# Patient Record
Sex: Female | Born: 1955
Health system: Southern US, Community
[De-identification: ages and names within clinical notes are randomized; demographics above are authoritative.]

## PROBLEM LIST (undated history)

## (undated) DIAGNOSIS — T7840XA Allergy, unspecified, initial encounter: Secondary | ICD-10-CM

## (undated) DIAGNOSIS — Z9289 Personal history of other medical treatment: Secondary | ICD-10-CM

## (undated) DIAGNOSIS — I890 Lymphedema, not elsewhere classified: Secondary | ICD-10-CM

## (undated) DIAGNOSIS — R011 Cardiac murmur, unspecified: Secondary | ICD-10-CM

## (undated) DIAGNOSIS — D649 Anemia, unspecified: Secondary | ICD-10-CM

## (undated) DIAGNOSIS — I10 Essential (primary) hypertension: Secondary | ICD-10-CM

## (undated) DIAGNOSIS — Z972 Presence of dental prosthetic device (complete) (partial): Secondary | ICD-10-CM

## (undated) DIAGNOSIS — K635 Polyp of colon: Secondary | ICD-10-CM

## (undated) DIAGNOSIS — E669 Obesity, unspecified: Secondary | ICD-10-CM

## (undated) DIAGNOSIS — M1990 Unspecified osteoarthritis, unspecified site: Secondary | ICD-10-CM

## (undated) HISTORY — PX: POLYPECTOMY: SHX149

## (undated) HISTORY — DX: Lymphedema, not elsewhere classified: I89.0

## (undated) HISTORY — DX: Presence of dental prosthetic device (complete) (partial): Z97.2

## (undated) HISTORY — PX: VEIN LIGATION AND STRIPPING: SHX2653

## (undated) HISTORY — DX: Allergy, unspecified, initial encounter: T78.40XA

## (undated) HISTORY — DX: Anemia, unspecified: D64.9

## (undated) HISTORY — DX: Cardiac murmur, unspecified: R01.1

## (undated) HISTORY — DX: Polyp of colon: K63.5

## (undated) HISTORY — DX: Obesity, unspecified: E66.9

## (undated) HISTORY — DX: Essential (primary) hypertension: I10

## (undated) HISTORY — PX: FINGER SURGERY: SHX640

## (undated) HISTORY — PX: COLONOSCOPY: SHX174

## (undated) HISTORY — DX: Unspecified osteoarthritis, unspecified site: M19.90

## (undated) HISTORY — DX: Personal history of other medical treatment: Z92.89

---

## 1997-11-10 ENCOUNTER — Other Ambulatory Visit: Admission: RE | Admit: 1997-11-10 | Discharge: 1997-11-10 | Payer: Self-pay | Admitting: Obstetrics

## 1999-12-11 ENCOUNTER — Other Ambulatory Visit: Admission: RE | Admit: 1999-12-11 | Discharge: 1999-12-11 | Payer: Self-pay | Admitting: Obstetrics

## 2000-02-14 ENCOUNTER — Encounter: Payer: Self-pay | Admitting: Obstetrics

## 2000-02-14 ENCOUNTER — Encounter: Admission: RE | Admit: 2000-02-14 | Discharge: 2000-02-14 | Payer: Self-pay | Admitting: Obstetrics

## 2001-02-13 ENCOUNTER — Encounter: Payer: Self-pay | Admitting: Obstetrics

## 2001-02-13 ENCOUNTER — Encounter: Admission: RE | Admit: 2001-02-13 | Discharge: 2001-02-13 | Payer: Self-pay | Admitting: Obstetrics

## 2002-02-18 ENCOUNTER — Encounter: Admission: RE | Admit: 2002-02-18 | Discharge: 2002-02-18 | Payer: Self-pay | Admitting: Obstetrics

## 2002-02-18 ENCOUNTER — Encounter: Payer: Self-pay | Admitting: Obstetrics

## 2003-02-22 ENCOUNTER — Encounter: Admission: RE | Admit: 2003-02-22 | Discharge: 2003-02-22 | Payer: Self-pay | Admitting: Obstetrics

## 2003-02-22 ENCOUNTER — Encounter: Payer: Self-pay | Admitting: Obstetrics

## 2004-06-16 ENCOUNTER — Encounter: Admission: RE | Admit: 2004-06-16 | Discharge: 2004-06-16 | Payer: Self-pay | Admitting: Obstetrics

## 2005-07-17 ENCOUNTER — Encounter: Admission: RE | Admit: 2005-07-17 | Discharge: 2005-07-17 | Payer: Self-pay | Admitting: Obstetrics

## 2006-08-14 ENCOUNTER — Encounter: Admission: RE | Admit: 2006-08-14 | Discharge: 2006-08-14 | Payer: Self-pay | Admitting: Family Medicine

## 2007-09-11 ENCOUNTER — Encounter: Admission: RE | Admit: 2007-09-11 | Discharge: 2007-09-11 | Payer: Self-pay | Admitting: Family Medicine

## 2008-09-13 ENCOUNTER — Encounter: Admission: RE | Admit: 2008-09-13 | Discharge: 2008-09-13 | Payer: Self-pay | Admitting: Family Medicine

## 2009-10-24 ENCOUNTER — Encounter: Admission: RE | Admit: 2009-10-24 | Discharge: 2009-10-24 | Payer: Self-pay | Admitting: Family Medicine

## 2010-07-02 ENCOUNTER — Encounter: Payer: Self-pay | Admitting: Family Medicine

## 2010-10-18 ENCOUNTER — Other Ambulatory Visit: Payer: Self-pay | Admitting: Family Medicine

## 2010-10-18 DIAGNOSIS — Z1231 Encounter for screening mammogram for malignant neoplasm of breast: Secondary | ICD-10-CM

## 2010-10-27 ENCOUNTER — Ambulatory Visit
Admission: RE | Admit: 2010-10-27 | Discharge: 2010-10-27 | Disposition: A | Discharge status: Home / Self Care | Payer: BC Managed Care – PPO | Source: Ambulatory Visit | Attending: Family Medicine | Admitting: Family Medicine

## 2010-10-27 DIAGNOSIS — Z1231 Encounter for screening mammogram for malignant neoplasm of breast: Secondary | ICD-10-CM

## 2011-06-04 ENCOUNTER — Ambulatory Visit (HOSPITAL_COMMUNITY)
Admission: RE | Admit: 2011-06-04 | Discharge: 2011-06-04 | Disposition: A | Discharge status: Home / Self Care | Payer: BC Managed Care – PPO | Source: Ambulatory Visit | Attending: Family Medicine | Admitting: Family Medicine

## 2011-06-04 ENCOUNTER — Ambulatory Visit (INDEPENDENT_AMBULATORY_CARE_PROVIDER_SITE_OTHER): Payer: BC Managed Care – PPO

## 2011-06-04 DIAGNOSIS — R609 Edema, unspecified: Secondary | ICD-10-CM

## 2011-06-04 DIAGNOSIS — M7989 Other specified soft tissue disorders: Secondary | ICD-10-CM

## 2011-06-04 DIAGNOSIS — M79609 Pain in unspecified limb: Secondary | ICD-10-CM

## 2011-06-04 DIAGNOSIS — N390 Urinary tract infection, site not specified: Secondary | ICD-10-CM

## 2011-06-04 NOTE — Progress Notes (Signed)
*  PRELIMINARY RESULTS*  Left lower extremity venous duplex completed. No evidence of DVT, superficial thrombosis, or Baker's cyst.  Gina Lopez, [place] D 06/04/2011, 12:20 PM

## 2011-06-05 ENCOUNTER — Ambulatory Visit (INDEPENDENT_AMBULATORY_CARE_PROVIDER_SITE_OTHER): Payer: BC Managed Care – PPO

## 2011-06-05 DIAGNOSIS — M79609 Pain in unspecified limb: Secondary | ICD-10-CM

## 2011-06-05 DIAGNOSIS — L0201 Cutaneous abscess of face: Secondary | ICD-10-CM

## 2011-06-05 DIAGNOSIS — A46 Erysipelas: Secondary | ICD-10-CM

## 2011-06-12 ENCOUNTER — Ambulatory Visit (INDEPENDENT_AMBULATORY_CARE_PROVIDER_SITE_OTHER): Payer: BC Managed Care – PPO

## 2011-06-12 DIAGNOSIS — I872 Venous insufficiency (chronic) (peripheral): Secondary | ICD-10-CM

## 2011-06-12 DIAGNOSIS — M79609 Pain in unspecified limb: Secondary | ICD-10-CM

## 2011-06-12 DIAGNOSIS — R609 Edema, unspecified: Secondary | ICD-10-CM

## 2011-09-27 ENCOUNTER — Ambulatory Visit (INDEPENDENT_AMBULATORY_CARE_PROVIDER_SITE_OTHER): Payer: BC Managed Care – PPO | Admitting: Family Medicine

## 2011-09-27 VITALS — BP 110/71 | HR 105 | Temp 100.1°F | Resp 18 | Ht 68.5 in | Wt 268.0 lb

## 2011-09-27 DIAGNOSIS — I10 Essential (primary) hypertension: Secondary | ICD-10-CM

## 2011-09-27 DIAGNOSIS — L0390 Cellulitis, unspecified: Secondary | ICD-10-CM

## 2011-09-27 LAB — POCT CBC
Granulocyte percent: 89.7 %G — AB (ref 37–80)
MID (cbc): 0.5 (ref 0–0.9)
MPV: 10.4 fL (ref 0–99.8)
POC Granulocyte: 16.5 — AB (ref 2–6.9)
POC LYMPH PERCENT: 7.4 %L — AB (ref 10–50)
POC MID %: 2.9 %M (ref 0–12)
Platelet Count, POC: 175 10*3/uL (ref 142–424)
RDW, POC: 15.9 %

## 2011-09-27 MED ORDER — DOXYCYCLINE HYCLATE 100 MG PO TABS: 100.0000 mg | ORAL_TABLET | Freq: Two times a day (BID) | ORAL | Status: DC

## 2011-09-27 NOTE — Progress Notes (Signed)
  Subjective:    Patient ID: Gina Lopez, female    DOB: [birth year], 56 y.o.   MRN: [id number]  HPI 56 yo female here with: 1) left leg pain, redness, and tenderness.  Swollen.  Started yesterday.  Same thing in December.  Was good untnil yesterday.  Also has fever, chills, bodyaches.  Treated for cellulitis and improved.  Anterior leg (shin).  No shortness of breath, chest pain.  Negative doppler during previous episode.    Review of Systems Negative except as per HPI     Objective:   Physical Exam  Constitutional: She appears well-developed.  Cardiovascular: Normal rate, regular rhythm, normal heart sounds and intact distal pulses.   Pulmonary/Chest: Effort normal and breath sounds normal.  Neurological: She is alert.   Left leg - anterior lower leg with erythema 2/3 up shin, warm, tender to palpation  Results for orders placed in visit on 09/27/11  POCT CBC      Component Value Range   WBC 18.4 (*) 4.6 - 10.2 (K/uL)   Lymph, poc 1.4  0.6 - 3.4    POC LYMPH PERCENT 7.4 (*) 10 - 50 (%L)   MID (cbc) 0.5  0 - 0.9    POC MID % 2.9  0 - 12 (%M)   POC Granulocyte 16.5 (*) 2 - 6.9    Granulocyte percent 89.7 (*) 37 - 80 (%G)   RBC 4.23  4.04 - 5.48 (M/uL)   Hemoglobin 10.5 (*) 12.2 - 16.2 (g/dL)   HCT, POC 40.9 (*) 81.1 - 47.9 (%)   MCV 79.5 (*) 80 - 97 (fL)   MCH, POC 24.8 (*) 27 - 31.2 (pg)   MCHC 31.3 (*) 31.8 - 35.4 (g/dL)   RDW, POC 91.4     Platelet Count, POC 175  142 - 424 (K/uL)   MPV 10.4  0 - 99.8 (fL)        Assessment & Plan:  LE cellulitis, supported by past history and white count.  Start doxycycline. F/u in 24-48 hours.

## 2011-09-28 LAB — BASIC METABOLIC PANEL
Calcium: 8.8 mg/dL (ref 8.4–10.5)
Potassium: 3.6 mEq/L (ref 3.5–5.3)
Sodium: 140 mEq/L (ref 135–145)

## 2011-09-29 ENCOUNTER — Encounter: Payer: Self-pay | Admitting: Emergency Medicine

## 2011-09-29 ENCOUNTER — Ambulatory Visit (INDEPENDENT_AMBULATORY_CARE_PROVIDER_SITE_OTHER): Payer: BC Managed Care – PPO | Admitting: Family Medicine

## 2011-09-29 VITALS — BP 125/75 | HR 75 | Temp 98.5°F | Resp 16 | Ht 68.5 in | Wt 268.6 lb

## 2011-09-29 DIAGNOSIS — I10 Essential (primary) hypertension: Secondary | ICD-10-CM

## 2011-09-29 DIAGNOSIS — L0291 Cutaneous abscess, unspecified: Secondary | ICD-10-CM

## 2011-09-29 DIAGNOSIS — L0390 Cellulitis, unspecified: Secondary | ICD-10-CM

## 2011-09-29 DIAGNOSIS — B353 Tinea pedis: Secondary | ICD-10-CM

## 2011-09-29 LAB — POCT CBC
Lymph, poc: 2.8 (ref 0.6–3.4)
MCH, POC: 24.7 pg — AB (ref 27–31.2)
MCHC: 31 g/dL — AB (ref 31.8–35.4)
MCV: 79.4 fL — AB (ref 80–97)
MID (cbc): 0.5 (ref 0–0.9)
MPV: 11.4 fL (ref 0–99.8)
POC MID %: 4.9 %M (ref 0–12)
Platelet Count, POC: 233 10*3/uL (ref 142–424)
WBC: 9.2 10*3/uL (ref 4.6–10.2)

## 2011-09-29 MED ORDER — LISINOPRIL-HYDROCHLOROTHIAZIDE 10-12.5 MG PO TABS: 1.0000 | ORAL_TABLET | Freq: Every day | ORAL | Status: DC

## 2011-09-29 NOTE — Progress Notes (Signed)
Subjective: Patient is here for a followup with regard to the cellulitis in her ankle. It is less erythematous than discomfort and discomfort is decreased from 2 days ago. Her white blood count was high. Her sugar was good at that time. She knows of no initial injury, hours she has cracking between her fourth and fifth toes  She wanted in blood pressure or refill  Objective: Very edematous left leg from the calf down. No calf tenderness. The erythema is decreased as per her description. The foot is a little bit warm. She is afebrile now. The toes have some cracking between them.  Assessment: Cellulitis Tinea pedis Hypertension  Plan: Continue antibiotics Check CBC Lamisil cream Call in a prescription for blood pressure medicine  Results for orders placed in visit on 09/29/11  POCT CBC      Component Value Range   WBC 9.2  4.6 - 10.2 (K/uL)   Lymph, poc 2.8  0.6 - 3.4    POC LYMPH PERCENT 30.3  10 - 50 (%L)   MID (cbc) 0.5  0 - 0.9    POC MID % 4.9  0 - 12 (%M)   POC Granulocyte 6.0  2 - 6.9    Granulocyte percent 64.8  37 - 80 (%G)   RBC 4.58  4.04 - 5.48 (M/uL)   Hemoglobin 11.3 (*) 12.2 - 16.2 (g/dL)   HCT, POC 29.5 (*) 62.1 - 47.9 (%)   MCV 79.4 (*) 80 - 97 (fL)   MCH, POC 24.7 (*) 27 - 31.2 (pg)   MCHC 31.0 (*) 31.8 - 35.4 (g/dL)   RDW, POC 30.8     Platelet Count, POC 233  142 - 424 (K/uL)   MPV 11.4  0 - 99.8 (fL)

## 2011-09-29 NOTE — Patient Instructions (Signed)
Use over-the-counter Lamisil cream generic terbinafine). Continue the doxycycline. Keep leg elevated as able.  I called in a blood pressure medication refill.

## 2011-10-02 ENCOUNTER — Telehealth: Payer: Self-pay

## 2011-10-02 NOTE — Telephone Encounter (Signed)
Patient states that she was here on Saturday and her legs are swollen worse, warm to touch and hurting worse. The redness has gotten better.  She feels like the antibiotic is not working.  Can you please advise on this?

## 2011-10-02 NOTE — Telephone Encounter (Signed)
.  umfc The patient called regarding her cellulitis of her leg.  Patient states she is on doxycycline and she does not feel that it is working.  Patient states that the area is still very swollen and hard.  Please call patient at [phone] to discuss her symptoms.

## 2011-10-03 ENCOUNTER — Ambulatory Visit (INDEPENDENT_AMBULATORY_CARE_PROVIDER_SITE_OTHER): Payer: BC Managed Care – PPO | Admitting: Family Medicine

## 2011-10-03 ENCOUNTER — Ambulatory Visit (HOSPITAL_COMMUNITY)
Admission: RE | Admit: 2011-10-03 | Discharge: 2011-10-03 | Disposition: A | Discharge status: Home / Self Care | Payer: BC Managed Care – PPO | Source: Ambulatory Visit | Attending: Family Medicine | Admitting: Family Medicine

## 2011-10-03 VITALS — BP 119/77 | HR 74 | Temp 97.9°F | Resp 18 | Ht 68.5 in | Wt 270.0 lb

## 2011-10-03 DIAGNOSIS — M79669 Pain in unspecified lower leg: Secondary | ICD-10-CM

## 2011-10-03 DIAGNOSIS — L02419 Cutaneous abscess of limb, unspecified: Secondary | ICD-10-CM

## 2011-10-03 DIAGNOSIS — M7989 Other specified soft tissue disorders: Secondary | ICD-10-CM | POA: Insufficient documentation

## 2011-10-03 DIAGNOSIS — M79609 Pain in unspecified limb: Secondary | ICD-10-CM | POA: Insufficient documentation

## 2011-10-03 DIAGNOSIS — L03119 Cellulitis of unspecified part of limb: Secondary | ICD-10-CM

## 2011-10-03 LAB — POCT CBC
Granulocyte percent: 65.9 %G (ref 37–80)
HCT, POC: 32.2 % — AB (ref 37.7–47.9)
MCV: 79.8 fL — AB (ref 80–97)
MID (cbc): 0.6 (ref 0–0.9)
POC Granulocyte: 7.1 — AB (ref 2–6.9)
Platelet Count, POC: 239 10*3/uL (ref 142–424)
RBC: 4.03 M/uL — AB (ref 4.04–5.48)
RDW, POC: 15.9 %

## 2011-10-03 NOTE — Telephone Encounter (Signed)
Spoke with patient and advised her to rtc.  She will be in today.

## 2011-10-03 NOTE — Progress Notes (Signed)
Subjective:    Patient ID: Gina Lopez, female    DOB: [birth year], 56 y.o.   MRN: [id number]  HPI Gina Lopez is a 56 y.o. female Dx with LLE cellulitis 09/27/11, started on doxycycline. initial WBC count 18.4, decreased to 9.2 on 4/20 followup.  Started on lamisil topical for tinea at that OV.  Continued doxycycline.  Feels like more swollen and tighter now.  More swollen for past 2 days.   No chest pain or difficulty breathing.  Redness in front of leg has slightly receded, but swelling is worse, mostly past 2 days.  Has had poor circulation in the legs.  Had ultrasound in December for leg swelling - no clot.  Vein specialist - [facility] - planning on vein stripping in 9 days.  Wearing support stockings until 1 week ago, when swelling in leg as above   Seen by vascular surgeon years ago - no recent eval. Told had poor circulation.     Review of Systems  Constitutional: Negative for fever and chills.  Respiratory: Negative for chest tightness and shortness of breath.   Cardiovascular: Negative for chest pain.   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     Objective:   Physical Exam  Constitutional: She appears well-developed and well-nourished.       Overweight   HENT:  Head: Normocephalic and atraumatic.  Right Ear: External ear normal.  Left Ear: External ear normal.  Neck: Normal range of motion.  Cardiovascular: Normal rate, regular rhythm, normal heart sounds and intact distal pulses.   Pulmonary/Chest: Effort normal and breath sounds normal.  Musculoskeletal:       Left lower leg: She exhibits tenderness and edema.       Edematous - proximal 1/3rd to distal calf.  With erythema and warmth to anterior aspect of distal leg and stasis changes.  Pain with homan's testing.  Unable to palpate dorsalis pedis pulse, but toes warm and cap refill less than 2 seconds.   See photo - Markedly edematous LLE compared to R.    Results for orders placed in visit on 10/03/11  POCT CBC      Component Value Range   WBC 10.8 (*) 4.6 - 10.2 (K/uL)   Lymph, poc 3.1  0.6 - 3.4    POC LYMPH PERCENT 28.8  10 - 50 (%L)   MID (cbc) 0.6  0 - 0.9    POC MID % 5.3  0 - 12 (%M)   POC Granulocyte 7.1 (*) 2 - 6.9    Granulocyte percent 65.9  37 - 80 (%G)   RBC 4.03 (*) 4.04 - 5.48 (M/uL)   Hemoglobin 9.8 (*) 12.2 - 16.2 (g/dL)   HCT, POC 40.9 (*) 81.1 - 47.9 (%)   MCV 79.8 (*) 80 - 97 (fL)   MCH, POC 24.3 (*) 27 - 31.2 (pg)   MCHC 30.4 (*) 31.8 - 35.4 (g/dL)   RDW, POC 91.4     Platelet Count, POC 239  142 - 424 (K/uL)   MPV 12.1  0 - 99.8 (fL)       Assessment & Plan:  Gina Lopez is a 56 y.o. female 1. Cellulitis, leg  POCT CBC  2. Leg swelling  POCT CBC, Lower Extremity Arterial Doppler Left, US Venous Img Lower Unilateral Left  3. Calf pain  Lower Extremity Arterial Doppler Left, US Venous Img Lower Unilateral Left   Slight improved erythema per pt, with recent  increase in edema.  Suspect component of stasis dermatitis, and by report underlying vascular insufficiency?  With acutely worsening edema, will refer for ultrasound to r/o DVT.  Elevate leg, compressive stckings as tolerated and continue doxycycline. Recheck in 48 hours, depending on u/s results.  May need arterial studies or vascular consult.    Addendum 1325: negative for DVT by call report.  Tech advised pt of results.  tx as above and recheck in 2 days.

## 2011-10-03 NOTE — Progress Notes (Signed)
*  PRELIMINARY RESULTS* Vascular Ultrasound Left Lower Extremity Venous Duplex has been completed.  Preliminary findings: Left= No evidence of DVT or baker's cyst.  Farrel Demark, RDMS 10/03/2011, 1:27 PM

## 2011-10-03 NOTE — Telephone Encounter (Signed)
We should recheck the cellulitis.  Gina Lopez

## 2011-10-05 ENCOUNTER — Ambulatory Visit (INDEPENDENT_AMBULATORY_CARE_PROVIDER_SITE_OTHER): Payer: BC Managed Care – PPO | Admitting: Family Medicine

## 2011-10-05 ENCOUNTER — Encounter: Payer: Self-pay | Admitting: Family Medicine

## 2011-10-05 VITALS — BP 122/76 | HR 90 | Temp 98.5°F | Resp 16 | Ht 67.5 in | Wt 270.8 lb

## 2011-10-05 DIAGNOSIS — L02419 Cutaneous abscess of limb, unspecified: Secondary | ICD-10-CM

## 2011-10-05 DIAGNOSIS — R609 Edema, unspecified: Secondary | ICD-10-CM

## 2011-10-05 DIAGNOSIS — M7989 Other specified soft tissue disorders: Secondary | ICD-10-CM

## 2011-10-05 DIAGNOSIS — I872 Venous insufficiency (chronic) (peripheral): Secondary | ICD-10-CM

## 2011-10-05 DIAGNOSIS — L03119 Cellulitis of unspecified part of limb: Secondary | ICD-10-CM

## 2011-10-05 LAB — POCT CBC
HCT, POC: 32.3 % — AB (ref 37.7–47.9)
Hemoglobin: 9.9 g/dL — AB (ref 12.2–16.2)
Lymph, poc: 2.6 (ref 0.6–3.4)
MCH, POC: 24.4 pg — AB (ref 27–31.2)
MCHC: 30.7 g/dL — AB (ref 31.8–35.4)
MCV: 79.6 fL — AB (ref 80–97)
POC Granulocyte: 5.3 (ref 2–6.9)
POC LYMPH PERCENT: 31.1 %L (ref 10–50)
RDW, POC: 14.9 %
WBC: 8.4 10*3/uL (ref 4.6–10.2)

## 2011-10-05 MED ORDER — CEPHALEXIN 500 MG PO CAPS: 500.0000 mg | ORAL_CAPSULE | Freq: Three times a day (TID) | ORAL | Status: AC

## 2011-10-05 MED ORDER — CEPHALEXIN 500 MG PO CAPS: 500.0000 mg | ORAL_CAPSULE | Freq: Four times a day (QID) | ORAL | Status: DC

## 2011-10-05 NOTE — Progress Notes (Signed)
  Subjective:    Patient ID: Gina Lopez, female    DOB: 07-Jun-1956, 56 y.o.   MRN: [id number]  HPI Gina Lopez is a 56 y.o. female See OV 2 days ago: 1. Cellulitis, leg  POCT CBC  2. Leg swelling  POCT CBC, Lower Extremity Arterial Doppler Left, US Venous Img Lower Unilateral Left  3. Calf pain  Lower Extremity Arterial Doppler Left, US Venous Img Lower Unilateral Left   Suspected component of stasis dermatitis with cellulitis, and by report underlying vascular insufficiency? Ultrasound obtained to r/o DVT - no DVT noted. Instructed to elevate leg, compressive stockings as tolerated and continue doxycycline.  Here for recheck: Compression stockings were too tight.  Using knee high hose instead.  Still swollen.  Still red in front.  Has been elevating.  Now on doxy for 1 week.  Feels ok otherwise.  Last vascular OV few years ago.   Review of Systems  Constitutional: Negative for fever and chills.  Skin:       Swelling, red - R Lower leg.       Objective:   Physical Exam  Constitutional: She is oriented to person, place, and time. She appears well-developed and well-nourished. No distress.  HENT:  Head: Normocephalic and atraumatic.  Neurological: She is alert and oriented to person, place, and time.  Skin: Skin is warm. Rash noted. There is erythema.          Erythema in anterior lower leg distribution, with 2-3+ LLE edema.  Foot warm , cap refill less than 2 seconds at toes, and moving toes without difficulty. Unable to palpate dorsalis pedis pulse, but easily detected with doppler.   Results for orders placed in visit on 10/05/11  POCT CBC      Component Value Range   WBC 8.4  4.6 - 10.2 (K/uL)   Lymph, poc 2.6  0.6 - 3.4    POC LYMPH PERCENT 31.1  10 - 50 (%L)   MID (cbc) 0.5  0 - 0.9    POC MID % 5.4  0 - 12 (%M)   POC Granulocyte 5.3  2 - 6.9    Granulocyte percent 63.5  37 - 80 (%G)   RBC 4.06  4.04 - 5.48 (M/uL)   Hemoglobin 9.9 (*) 12.2 - 16.2 (g/dL)   HCT,  POC 40.9 (*) 81.1 - 47.9 (%)   MCV 79.6 (*) 80 - 97 (fL)   MCH, POC 24.4 (*) 27 - 31.2 (pg)   MCHC 30.7 (*) 31.8 - 35.4 (g/dL)   RDW, POC 91.4     Platelet Count, POC 270  142 - 424 (K/uL)   MPV 9.2  0 - 99.8 (fL)        Assessment & Plan:  Gina Lopez is a 56 y.o. female 1. Cellulitis, leg  POCT CBC  2. Leg swelling  POCT CBC  3. Stasis dermatitis     Persistent erythema - suspect cellulitis still present, with coexisting stasis dermatitis.  2nd MD exam performed. Add keflex 500mg  tid #21, and recheck in 48hours.  Elevate leg as much as possible during day.  As erythema improves, if still edematous, may need vascular eval.

## 2011-10-05 NOTE — Progress Notes (Signed)
Addended by: Meredith Staggers R on: 10/05/2011 08:38 AM   Modules accepted: Orders

## 2011-10-07 ENCOUNTER — Ambulatory Visit (INDEPENDENT_AMBULATORY_CARE_PROVIDER_SITE_OTHER): Payer: BC Managed Care – PPO | Admitting: Family Medicine

## 2011-10-07 VITALS — BP 122/78 | HR 79 | Temp 98.7°F | Resp 18 | Wt 275.0 lb

## 2011-10-07 DIAGNOSIS — M7989 Other specified soft tissue disorders: Secondary | ICD-10-CM

## 2011-10-07 DIAGNOSIS — L0390 Cellulitis, unspecified: Secondary | ICD-10-CM

## 2011-10-07 DIAGNOSIS — I8310 Varicose veins of unspecified lower extremity with inflammation: Secondary | ICD-10-CM

## 2011-10-07 DIAGNOSIS — I872 Venous insufficiency (chronic) (peripheral): Secondary | ICD-10-CM

## 2011-10-07 MED ORDER — DOXYCYCLINE HYCLATE 100 MG PO TABS: 100.0000 mg | ORAL_TABLET | Freq: Two times a day (BID) | ORAL | Status: AC

## 2011-10-07 NOTE — Progress Notes (Signed)
  Subjective:    Patient ID: Gina Lopez, female    DOB: January 31, 1956, 56 y.o.   MRN: [id number]  HPI Gina Lopez is a 56 y.o. female L leg swelling, cellulitis - see last ov, added keflex, continued doxycycline - last dose this morning. No fever, not much change in redness.  Getting more wrinkles in area. Still feels swollen. No side effects with meds.  Tried wearing stocking.  Causes foot to swell.  Is elevating leg when sitting - but propped up only.     Review of Systems  Constitutional: Negative for fever and chills.  Skin: Positive for rash.       Redness front of leg.       Objective:   Physical Exam  Constitutional: She appears well-developed and well-nourished.  HENT:  Head: Normocephalic and atraumatic.  Pulmonary/Chest: Effort normal.  Skin: Skin is warm. There is erythema.             Assessment & Plan:  Gina Lopez is a 56 y.o. female 1. Leg swelling   2. Stasis dermatitis   3. Cellulitis   persistent unilateral leg swelling.  Possible secondary cellulitis vs component of stasis dermatitis. Will continue doxycycline, and Keflex.  Discussed elevating foot above level of heart, and compresion as able.  Refer to vascular surgeon for eval.  rtc sooner if worse, or recheck in next 3-4 days if not yet seen by surgeon. rtc precautions.

## 2011-10-09 ENCOUNTER — Other Ambulatory Visit: Payer: Self-pay | Admitting: Family Medicine

## 2011-10-09 DIAGNOSIS — Z1231 Encounter for screening mammogram for malignant neoplasm of breast: Secondary | ICD-10-CM

## 2011-10-10 ENCOUNTER — Ambulatory Visit (INDEPENDENT_AMBULATORY_CARE_PROVIDER_SITE_OTHER): Payer: BC Managed Care – PPO | Admitting: Family Medicine

## 2011-10-10 VITALS — BP 138/76 | HR 85 | Temp 98.4°F | Resp 18 | Ht 68.25 in | Wt 275.0 lb

## 2011-10-10 DIAGNOSIS — M25469 Effusion, unspecified knee: Secondary | ICD-10-CM

## 2011-10-10 DIAGNOSIS — L02419 Cutaneous abscess of limb, unspecified: Secondary | ICD-10-CM

## 2011-10-10 DIAGNOSIS — M7989 Other specified soft tissue disorders: Secondary | ICD-10-CM

## 2011-10-10 DIAGNOSIS — L03116 Cellulitis of left lower limb: Secondary | ICD-10-CM

## 2011-10-10 NOTE — Progress Notes (Signed)
  Subjective:    Patient ID: Suszanne Finch, female    DOB: 1956-03-18, 56 y.o.   MRN: [id number]  HPI ROSALVA NEARY is a 56 y.o. female Here for follow up left leg swelling - suspected cellulitis/stasis derm with underlying insufficiency. On doxycycline, and Keflex, [facility].  Feeling ok.  Leg is becoming less swollen.  More wrinkles noted.  No fevers. Occasional prickly feeling, tingling front and side of leg.  Still taking antibiotics without difficulty.   Review of Systems  Constitutional: Negative for fever and chills.  Musculoskeletal:       L leg swelling       Objective:   Physical Exam  Constitutional: She is oriented to person, place, and time. She appears well-developed and well-nourished.  Pulmonary/Chest: Effort normal.  Musculoskeletal:       Left leg edematous with slight warmth and erythema. Wrinkling of skin, and less swollen/tight since last ov.  wearing compression stocking.  Neurological: She is alert and oriented to person, place, and time.       NVI distally, toes with cap refill less than 1 second.   See photo    Assessment & Plan:  SHAVY BEACHEM is a 56 y.o. female 1. Left leg cellulitis   2. Swelling of joint of lower leg    Improving - cont keflex and doxy, follow up with vein specialists. RTC or call if antibiotics run out and any increase sx's.

## 2011-10-26 ENCOUNTER — Encounter: Payer: Self-pay | Admitting: Vascular Surgery

## 2011-10-29 ENCOUNTER — Ambulatory Visit
Admission: RE | Admit: 2011-10-29 | Discharge: 2011-10-29 | Disposition: A | Discharge status: Home / Self Care | Payer: BC Managed Care – PPO | Source: Ambulatory Visit | Attending: Family Medicine | Admitting: Family Medicine

## 2011-10-29 ENCOUNTER — Encounter: Payer: BC Managed Care – PPO | Admitting: Vascular Surgery

## 2011-10-29 DIAGNOSIS — Z1231 Encounter for screening mammogram for malignant neoplasm of breast: Secondary | ICD-10-CM

## 2012-01-09 ENCOUNTER — Encounter: Payer: Self-pay | Admitting: Medical

## 2012-01-09 ENCOUNTER — Ambulatory Visit
Admission: RE | Admit: 2012-01-09 | Discharge: 2012-01-09 | Disposition: A | Discharge status: Home / Self Care | Payer: BC Managed Care – PPO | Source: Ambulatory Visit | Attending: Medical | Admitting: Medical

## 2012-01-09 ENCOUNTER — Ambulatory Visit (INDEPENDENT_AMBULATORY_CARE_PROVIDER_SITE_OTHER): Payer: BC Managed Care – PPO | Admitting: Medical

## 2012-01-09 VITALS — BP 130/70 | HR 82 | Temp 97.9°F | Resp 16 | Ht 67.5 in | Wt 277.0 lb

## 2012-01-09 DIAGNOSIS — I10 Essential (primary) hypertension: Secondary | ICD-10-CM | POA: Insufficient documentation

## 2012-01-09 DIAGNOSIS — I739 Peripheral vascular disease, unspecified: Secondary | ICD-10-CM

## 2012-01-09 DIAGNOSIS — M79606 Pain in leg, unspecified: Secondary | ICD-10-CM

## 2012-01-09 DIAGNOSIS — R609 Edema, unspecified: Secondary | ICD-10-CM

## 2012-01-09 DIAGNOSIS — R011 Cardiac murmur, unspecified: Secondary | ICD-10-CM | POA: Insufficient documentation

## 2012-01-09 DIAGNOSIS — M79609 Pain in unspecified limb: Secondary | ICD-10-CM

## 2012-01-09 LAB — CBC WITH DIFFERENTIAL/PLATELET
Eosinophils Absolute: 0.2 10*3/uL (ref 0.0–0.7)
Eosinophils Relative: 2 % (ref 0–5)
HCT: 34.8 % — ABNORMAL LOW (ref 36.0–46.0)
Hemoglobin: 11.1 g/dL — ABNORMAL LOW (ref 12.0–15.0)
Lymphocytes Relative: 34 % (ref 12–46)
Lymphs Abs: 2.3 10*3/uL (ref 0.7–4.0)
MCH: 25.1 pg — ABNORMAL LOW (ref 26.0–34.0)
MCV: 78.7 fL (ref 78.0–100.0)
Monocytes Relative: 5 % (ref 3–12)
Platelets: 211 10*3/uL (ref 150–400)
RBC: 4.42 MIL/uL (ref 3.87–5.11)
WBC: 6.8 10*3/uL (ref 4.0–10.5)

## 2012-01-09 LAB — COMPREHENSIVE METABOLIC PANEL
Alkaline Phosphatase: 127 U/L — ABNORMAL HIGH (ref 39–117)
BUN: 13 mg/dL (ref 6–23)
CO2: 27 mEq/L (ref 19–32)
Creat: 0.74 mg/dL (ref 0.50–1.10)
Glucose, Bld: 107 mg/dL — ABNORMAL HIGH (ref 70–99)
Sodium: 144 mEq/L (ref 135–145)
Total Bilirubin: 0.4 mg/dL (ref 0.3–1.2)
Total Protein: 6.9 g/dL (ref 6.0–8.3)

## 2012-01-09 LAB — POCT URINALYSIS DIPSTICK
Glucose, UA: NEGATIVE
Ketones, UA: NEGATIVE
Leukocytes, UA: NEGATIVE
Protein, UA: NEGATIVE
Urobilinogen, UA: NEGATIVE

## 2012-01-09 NOTE — Progress Notes (Addendum)
Subjective: New patient today.  Was seeing urgent care prior.  Here for "legs acting up again."  When she was 56yo started having right leg swelling and pain, age 29yo the left leg started doing this.   Was prescribed support hose as a child and throughout the years c/t to use support hose.  She notes though in the last few years, the left leg continues to give her problems.  This past December ended up having cellulitis of the left leg.   End up having ultrasound to rule out clot, was treated with antibiotics.  April this year had cellulitis again of left leg.  Ended up seeing vein surgeon.  Going to have vein stripping of the left leg for venous insufficiency.  Main c/o today is ongoing swelling in left leg, and right leg is hurting today in front of lower leg.  The right leg pain is intermittent, worse with walking. She denies hx/o screening for PVD.   No prior cardiology workup other than EKGs.  She notes last several pap smears have been normal.  Otherwise in normal state of health.  She does not exercise.    Allergies  Allergen Reactions  . Codeine Other (See Comments)    Pt states it makes her feel dizzy and stay in her system forever  . Terbinafine And Related Hives and Rash    Current Outpatient Prescriptions on File Prior to Visit  Medication Sig Dispense Refill  . lisinopril-hydrochlorothiazide (PRINZIDE,ZESTORETIC) 10-12.5 MG per tablet Take 1 tablet by mouth daily.  30 tablet  5    Past Medical History  Diagnosis Date  . Hypertension   . Edema   . Heart murmur   . Obesity     History reviewed. No pertinent past surgical history.  History reviewed. No pertinent family history.  History   Social History  . Marital Status: Single    Spouse Name: N/A    Number of Children: N/A  . Years of Education: N/A   Occupational History  . Not on file.   Social History Main Topics  . Smoking status: Never Smoker   . Smokeless tobacco: Not on file  . Alcohol Use: No  . Drug Use:  No  . Sexually Active: Not Currently   Other Topics Concern  . Not on file   Social History Narrative  . No narrative on file    Reviewed their medical, surgical, family, social, medication, and allergy history and updated chart as appropriate.  Review of Systems Constitutional: -fever, -chills, -sweats, -unexpected -weight change,-fatigue ENT: -runny nose, -ear pain, -sore throat Cardiology:  -chest pain, -palpitations, -edema Respiratory: -cough, -shortness of breath, -wheezing Gastroenterology: -abdominal pain, -nausea, -vomiting, -diarrhea, -constipation  Hematology: -bleeding or bruising problems Urology: -dysuria, -difficulty urinating, -hematuria, -urinary frequency, -urgency Neurology: -headache, -weakness, -tingling, -numbness    Objective:   Physical Exam  Filed Vitals:   01/09/12 0901  BP: 130/70  Pulse: 82  Temp: 97.9 F (36.6 C)  Resp: 16    General appearance: alert, no distress, WD/WN, obese AA female Skin: no ulcers or worrisome lesions,LE warm, dry Oral cavity: MMM, no lesions Neck: supple, no lymphadenopathy, no thyromegaly, no masses Heart: II/VI crescendo systolic murmur heard best in upper sternal borders, but heard somewhat throughout, prominent right neck pulsation, but otherwise RRR, normal S2 Lungs: CTA bilaterally, no wheezes, rhonchi, or rales Abdomen: +bs, soft, non tender, non distended, no masses, no hepatomegaly, no splenomegaly Pulses: 1+ symmetric, upper and lower extremities, normal cap refill MSK:  tender bilat lower legs generalized, normal LE ROM Ext: left lower leg with generalized edema, 1+ nonpitting, asymmetrical compared to the right, but body habitus is obese as well including extremity fatty tissue   Assessment and Plan :    Encounter Diagnoses  Name Primary?  . Heart murmur Yes  . Edema   . Essential hypertension, benign   . Claudication of right lower extremity   . Leg pain    Heart murmur, possible right leg  claudication - Given her age, no prior eval of the murmur, hx/o hypertension, neck pulsation and 1+ pulses in general, will refer for cardiology consult with [facility] for hopefully echo and ABIs and other tests as deemed appropriate.    Edema - likely venous insufficiency of the left leg, but labs today to further evaluate  HTN - c/t same medication  Claudication - cardiology referral  Leg pain - right leg, xray order today.  Etiology unclear.   Follow-up pending labs, xray, referral

## 2012-02-22 DIAGNOSIS — Z9289 Personal history of other medical treatment: Secondary | ICD-10-CM

## 2012-02-22 HISTORY — DX: Personal history of other medical treatment: Z92.89

## 2012-02-26 ENCOUNTER — Encounter: Payer: Self-pay | Admitting: Medical

## 2012-03-14 ENCOUNTER — Ambulatory Visit (INDEPENDENT_AMBULATORY_CARE_PROVIDER_SITE_OTHER): Payer: BC Managed Care – PPO | Admitting: Medical

## 2012-03-14 ENCOUNTER — Encounter: Payer: Self-pay | Admitting: Medical

## 2012-03-14 VITALS — BP 126/80 | HR 80 | Resp 18 | Wt 283.0 lb

## 2012-03-14 DIAGNOSIS — I10 Essential (primary) hypertension: Secondary | ICD-10-CM

## 2012-03-14 DIAGNOSIS — Z23 Encounter for immunization: Secondary | ICD-10-CM

## 2012-03-14 DIAGNOSIS — I872 Venous insufficiency (chronic) (peripheral): Secondary | ICD-10-CM

## 2012-03-14 DIAGNOSIS — R609 Edema, unspecified: Secondary | ICD-10-CM

## 2012-03-14 DIAGNOSIS — M79609 Pain in unspecified limb: Secondary | ICD-10-CM

## 2012-03-14 DIAGNOSIS — M79604 Pain in right leg: Secondary | ICD-10-CM

## 2012-03-14 NOTE — Progress Notes (Signed)
Subjective: Last visit here in July.  At that visit was referred to cardiology for edema, heart murmur, possible claudication and to rule out heart problems.  Ended up having echocardiogram.   Here today since she is having ongoing right lower leg pain.  She sees [facility] for chronic lower extremity venous insufficiency.  After last visit saw Dr. Rennis Golden cardiology in [place].  She is not sure if she had ABIs.  She has left leg edema which is what she sees vein clinic for, but no longer has right leg edema.  She denies restless leg issues.  Just reports pain in right lower anterior leg only.  No knee pain, hip pain, no color changes of the leg.  denies numbness, tingling or weakness.  At last visit, had xray which showed no fracture or bony issues.  She has compression hose but not using them routinely.  Compression hose doesn't seem to help the right leg pain.   Past Medical History  Diagnosis Date  . Hypertension   . Edema   . Heart murmur   . Obesity    ROS as in HPI above  Objective: Gen: wd, wn, nad Skin: no rash, no erythema, no ecchymosis MSK: legs nontender, normal LE ROM Ext: left lower leg with 2+ edema generalized, right lower leg minimal lower extremity edema Neuro: legs with normal strength, sensation, DTRs   Assessment: Encounter Diagnoses  Name Primary?  . Leg pain, right Yes  . Edema   . Venous insufficiency   . Essential hypertension, benign    Plan: Leg pain - etiology unclear.  Will request cardiology records.    Edema, venous insufficiency - advised compression hose, f/u with vein clinic.  [facility] from [facility].  HTN - c/t current medication

## 2012-03-17 ENCOUNTER — Telehealth: Payer: Self-pay | Admitting: Family Medicine

## 2012-03-17 NOTE — Addendum Note (Signed)
Addended by: Barbette Or A on: 03/17/2012 11:59 AM   Modules accepted: Orders

## 2012-03-17 NOTE — Telephone Encounter (Signed)
i forgot to put it in there but i did it earlier this morning! Thank you for reminding me

## 2012-03-17 NOTE — Telephone Encounter (Signed)
Message copied by Janeice Robinson on Mon Mar 17, 2012  1:50 PM ------      Message from: Aleen Campi, Kermit Balo      Created: Mon Mar 17, 2012  1:32 PM       Melissa asked me about flu vaccine documentation from her recent visit.  pls check on this.

## 2012-03-17 NOTE — Telephone Encounter (Signed)
Can you check on flu documentation on this patient?

## 2012-03-26 ENCOUNTER — Telehealth: Payer: Self-pay | Admitting: Family Medicine

## 2012-03-26 NOTE — Telephone Encounter (Signed)
Message copied by Janeice Robinson on Wed Mar 26, 2012 12:40 PM ------      Message from: Aleen Campi, DAVID S      Created: Wed Mar 26, 2012  7:50 AM       I reviewed the cardiology notes that came in including ultrasound of heart and doppler studies.               At this point I still can't see where she has had ABIs.  Thus, pls set her up for ABIs to help see if she has an arterial issue in the legs to cause claudication.             I do recommend she wear her compression hose daily.            Lets see her back after ABIs to discuss possible treatment options

## 2012-03-26 NOTE — Telephone Encounter (Signed)
PATIENT IS AWARE OF Gina Lopez'S RECOMMENDATIONS FOR HER CARE. CLS

## 2012-04-23 ENCOUNTER — Encounter: Payer: Self-pay | Admitting: Medical

## 2012-08-23 ENCOUNTER — Ambulatory Visit (INDEPENDENT_AMBULATORY_CARE_PROVIDER_SITE_OTHER): Payer: BC Managed Care – PPO | Admitting: Family Medicine

## 2012-08-23 VITALS — BP 140/82 | HR 101 | Temp 98.0°F | Resp 18 | Ht 68.5 in | Wt 286.6 lb

## 2012-08-23 DIAGNOSIS — M25561 Pain in right knee: Secondary | ICD-10-CM

## 2012-08-23 DIAGNOSIS — I10 Essential (primary) hypertension: Secondary | ICD-10-CM

## 2012-08-23 DIAGNOSIS — M25569 Pain in unspecified knee: Secondary | ICD-10-CM

## 2012-08-23 LAB — COMPREHENSIVE METABOLIC PANEL
ALT: 15 U/L (ref 0–35)
AST: 12 U/L (ref 0–37)
Albumin: 4 g/dL (ref 3.5–5.2)
Alkaline Phosphatase: 116 U/L (ref 39–117)
BUN: 11 mg/dL (ref 6–23)
CO2: 26 mEq/L (ref 19–32)
Calcium: 8.9 mg/dL (ref 8.4–10.5)
Chloride: 107 mEq/L (ref 96–112)
Creat: 0.86 mg/dL (ref 0.50–1.10)
Glucose, Bld: 90 mg/dL (ref 70–99)
Potassium: 4.1 mEq/L (ref 3.5–5.3)
Sodium: 139 mEq/L (ref 135–145)
Total Bilirubin: 0.5 mg/dL (ref 0.3–1.2)
Total Protein: 7.2 g/dL (ref 6.0–8.3)

## 2012-08-23 MED ORDER — LISINOPRIL-HYDROCHLOROTHIAZIDE 10-12.5 MG PO TABS: 1.0000 | ORAL_TABLET | Freq: Every day | ORAL | Status: DC

## 2012-08-23 MED ORDER — MELOXICAM 7.5 MG PO TABS: 7.5000 mg | ORAL_TABLET | Freq: Every day | ORAL | Status: DC

## 2012-08-23 MED ORDER — POTASSIUM CHLORIDE CRYS ER 20 MEQ PO TBCR: 20.0000 meq | EXTENDED_RELEASE_TABLET | Freq: Every day | ORAL | Status: DC

## 2012-08-23 MED ORDER — FUROSEMIDE 20 MG PO TABS: 20.0000 mg | ORAL_TABLET | Freq: Two times a day (BID) | ORAL | Status: DC

## 2012-08-23 NOTE — Progress Notes (Signed)
Patient ID: AHMONI EDGE MRN: [id number], DOB: 22-Aug-1955, 57 y.o. Date of Encounter: 08/23/2012, 8:52 AM  Primary Physician: Shade Flood, MD  Chief Complaint: HTN  HPI: 57 y.o. year old female with history below presents for hypertension follow up.  Ran out of meds 1 week ago.  C/o right lower leg pain for the last several days.  No trauma or new activity.  Pain is when she is sitting, relieved by straightening the leg, located below the knee anteriorly in a bandlike distribution  Diet consists of No CP, HA, visual changes, or focal deficits.   Past Medical History  Diagnosis Date  . Hypertension   . Edema   . Heart murmur   . Obesity   . Anemia      Home Meds: Prior to Admission medications   Medication Sig Start Date End Date Taking? Authorizing Provider  furosemide (LASIX) 20 MG tablet Take 20 mg by mouth 2 (two) times daily.   Yes Historical Provider, MD  lisinopril-hydrochlorothiazide (PRINZIDE,ZESTORETIC) 10-12.5 MG per tablet Take 1 tablet by mouth daily. 09/29/11  Yes Peyton Najjar, MD  potassium chloride SA (K-DUR,KLOR-CON) 20 MEQ tablet Take 20 mEq by mouth 2 (two) times daily.   Yes Historical Provider, MD    Allergies:  Allergies  Allergen Reactions  . Codeine Other (See Comments)    Pt states it makes her feel dizzy and stay in her system forever  . Terbinafine And Related Hives and Rash    History   Social History  . Marital Status: Single    Spouse Name: N/A    Number of Children: N/A  . Years of Education: N/A   Occupational History  . Not on file.   Social History Main Topics  . Smoking status: Never Smoker   . Smokeless tobacco: Not on file  . Alcohol Use: No  . Drug Use: No  . Sexually Active: Not Currently   Other Topics Concern  . Not on file   Social History Narrative  . No narrative on file     Family History  Problem Relation Age of Onset  . Arthritis Mother   . Hypertension Mother   . Obesity Mother   . Alcohol  abuse Father     Review of Systems: Constitutional: negative for chills, fever, night sweats, weight changes, or fatigue  HEENT: negative for vision changes, hearing loss, congestion, rhinorrhea, ST, epistaxis, or sinus pressure Cardiovascular: negative for chest pain, palpitations, or DOE Respiratory: negative for hemoptysis, wheezing, shortness of breath, or cough Abdominal: negative for abdominal pain, nausea, vomiting, diarrhea, or constipation Dermatological: negative for rash Neurologic: negative for headache, dizziness, or syncope All other systems reviewed and are otherwise negative with the exception to those above and in the HPI.   Physical Exam: Blood pressure 140/82, pulse 101, temperature 98 F (36.7 C), temperature source Oral, resp. rate 18, height 5' 8.5" (1.74 m), weight 286 lb 9.6 oz (130.001 kg), SpO2 98.00%., Body mass index is 42.94 kg/(m^2). General: Well developed, well nourished, in no acute distress. Head: Normocephalic, atraumatic, eyes without discharge, sclera non-icteric, nares are without discharge. Bilateral auditory canals clear, TM's are without perforation, pearly grey and translucent with reflective cone of light bilaterally. Oral cavity moist, posterior pharynx without exudate, erythema, peritonsillar abscess, or post nasal drip.  Neck: Supple. No thyromegaly. Full ROM. No lymphadenopathy. No carotid bruits. Lungs: Clear bilaterally to auscultation without wheezes, rales, or rhonchi. Breathing is unlabored. Heart: RRR with S1 S2. No murmurs,  rubs, or gallops appreciated.  Abdomen: Soft, non-tender, non-distended with normoactive bowel sounds. No hepatosplenomegaly. No rebound/guarding. No obvious abdominal masses. Msk:  Strength and tone normal for age. Extremities/Skin: Warm and dry. No clubbing or cyanosis. No edema. No rashes or suspicious lesions. Distal pulses 2+ and equal bilaterally. Neuro: Alert and oriented X 3. Moves all extremities  spontaneously. Gait is normal. CNII-XII grossly in tact. DTR 2+, cerebellar function intact. Rhomberg normal. Psych:  Responds to questions appropriately with a normal affect.   Labs:  CMP pending  ASSESSMENT AND PLAN:  57 y.o. year old female with htn and leg pain thought to be a tendonitis HTN (hypertension) - Plan: furosemide (LASIX) 20 MG tablet, lisinopril-hydrochlorothiazide (PRINZIDE,ZESTORETIC) 10-12.5 MG per tablet, potassium chloride SA (K-DUR,KLOR-CON) 20 MEQ tablet, Comprehensive metabolic panel  Pain in joint, lower leg, right - Plan: meloxicam (MOBIC) 7.5 MG tablet   -  Signed, Elvina Sidle, MD 08/23/2012 8:52 AM

## 2012-10-27 ENCOUNTER — Other Ambulatory Visit: Payer: Self-pay

## 2012-10-27 DIAGNOSIS — Z1231 Encounter for screening mammogram for malignant neoplasm of breast: Secondary | ICD-10-CM

## 2012-11-11 ENCOUNTER — Ambulatory Visit
Admission: RE | Admit: 2012-11-11 | Discharge: 2012-11-11 | Disposition: A | Discharge status: Home / Self Care | Payer: BC Managed Care – PPO | Source: Ambulatory Visit

## 2012-11-11 DIAGNOSIS — Z1231 Encounter for screening mammogram for malignant neoplasm of breast: Secondary | ICD-10-CM

## 2012-11-14 ENCOUNTER — Encounter: Payer: Self-pay | Admitting: Family Medicine

## 2013-03-11 ENCOUNTER — Encounter: Payer: Self-pay | Admitting: Medical

## 2013-03-11 ENCOUNTER — Ambulatory Visit (INDEPENDENT_AMBULATORY_CARE_PROVIDER_SITE_OTHER): Payer: BC Managed Care – PPO | Admitting: Medical

## 2013-03-11 VITALS — BP 110/70 | HR 72 | Temp 97.9°F | Resp 16 | Wt 277.0 lb

## 2013-03-11 DIAGNOSIS — D229 Melanocytic nevi, unspecified: Secondary | ICD-10-CM

## 2013-03-11 DIAGNOSIS — L299 Pruritus, unspecified: Secondary | ICD-10-CM

## 2013-03-11 DIAGNOSIS — L909 Atrophic disorder of skin, unspecified: Secondary | ICD-10-CM

## 2013-03-11 DIAGNOSIS — L918 Other hypertrophic disorders of the skin: Secondary | ICD-10-CM

## 2013-03-11 DIAGNOSIS — D239 Other benign neoplasm of skin, unspecified: Secondary | ICD-10-CM

## 2013-03-11 DIAGNOSIS — D492 Neoplasm of unspecified behavior of bone, soft tissue, and skin: Secondary | ICD-10-CM

## 2013-03-11 MED ORDER — HYDROXYZINE HCL 10 MG PO TABS: 10.0000 mg | ORAL_TABLET | Freq: Three times a day (TID) | ORAL | Status: DC | PRN

## 2013-03-11 NOTE — Progress Notes (Signed)
Subjective: Here for c/o itching on arms, neck, back and legs.   Started a month ago, but it is getting more intense now.  Has been a daily problem for a month or more.   Got dove soap to use for now.  Had similar itch in the past, but was thought to be related to dryness in the air.  Denies rash.  No contacts with similar itch.  Works in Designer, fashion/clothing.  No new detergents, no new foods, no new animals, no new exposures.   Not sure of the trigger.  Using nothing for the itches.  Just scratches her self.  Denies other allergy symptoms, no rash, no fever, no SOB, wheezing, no NVD, no abdominal pain, no bloating.    Has several skin tags that are annoying her.  Has one changing mole of left neck, new, hard, irritated.  Has several under both arms, 2 on back of right neck and some under left breast.   Past Medical History  Diagnosis Date  . Hypertension   . Edema   . Heart murmur   . Obesity   . Anemia    ROS as in subjective  Objective: Gen: wd, wn, nad Skin: in general no rash or excoriation  Lesions for removal Left neck with hard irritated 1mm x 2mm growth, somewhat pedunculated, appears to be inflamed skin tag vs horn type lesion (sent for biopsy/pathology). Two small skin tags of right posterior neck/fleshy skin color pedunculated soft lesions. Left upper chest/anterior axilla with 3mm x 3mm round raised papule, brown (sent for biopsy/pathology).  Several similar skin tags/fleshy pedunculated skin colored lesions of bilat axilla, 2 similar pedunculated lesion of left chest wall under breast.  None appear worrisome.  Assessment: Encounter Diagnoses  Name Primary?  . Neoplasm of skin of neck Yes  . Skin tag   . Changing mole   . Pruritic condition    Plan: Pruritic condition - discussed possible causes including underlying problems such as diabetes, anemia, hepatitis vs allergic reaction vs dry skin.   Begin hydroxyzine.   discussed baseline labs and or allergy panel.  She declines labs  today,but wants to try hydroxyzine and watch and wait approach.  Advised dove sensitive skin soap, hypoallergenic detergent, bland foods such as chicken and rice without seasoning for now, and gradually add back foods to watch for rash/itching.  Avoid new exposures.  F/u 2-3 wk.  Discussed risks/benefits of procedure, and she consents to skin tag and excision/biopsy of 2 lesions.  Cleaned and prepped separate areas at a time in normal sterile fashion including left neck, right posterior neck, left axilla, right axilla, and left chest under breast.    Left neck - excised the crusted pedunculated lesion 1mm x 2mm with ethyl acetate spray for anesthesia and scissors (sent to pathology) Right posterior neck - excised the 2 small benign skin tags with ethyl acetate spray for anesthesia and scissors Right axilla - 8 benign skin tags, mostly 1-2 mm diameter, with the inferior three being about 3mm x 2mm diameter each, all lesions pedunculated.   Lower 3 larger skin tags - area was anesthestized locally with 1% lidocaine with epi.   All other lesions used ethyl acetate spray for anesthesia.  All were removed/excised with scissors.   Left axilla - 8 benign skin tags, all 1-2 mm diameter, all anesthetized with ethyl acetate spray, excised by scissors Left upper chest -one 3mm diameter papule, thicker lesion, brown, anesthetized with 1% lidocaine with epi, excised with scissors (sent  to pathology) Left chest wall under breast with 2 small pedunculated skin tags, both anesthetized with ethyl acetate spray, excised by scissors  All lesions either stopped bleeding within a minute of excision or a few required a touch of silver nitrate stick. She tolerated procedure well.  All areas cleaned with saline.  EBL <2cc total.  F/u pending pathology results.

## 2013-03-18 ENCOUNTER — Telehealth: Payer: Self-pay | Admitting: Family Medicine

## 2013-03-18 NOTE — Telephone Encounter (Signed)
Patient is aware of her 2 biopsy reports. CLS

## 2013-03-18 NOTE — Telephone Encounter (Signed)
Message copied by Janeice Robinson on Wed Mar 18, 2013 11:30 AM ------      Message from: Jac Canavan      Created: Tue Mar 17, 2013  7:53 PM       Her biopsy results show that one lesion was a wart, and the other lesion was an epidermoid cyst.  Neither are worrisome and we took them off completely.   ------

## 2013-03-19 ENCOUNTER — Encounter: Payer: Self-pay | Admitting: Medical

## 2013-08-11 ENCOUNTER — Ambulatory Visit (INDEPENDENT_AMBULATORY_CARE_PROVIDER_SITE_OTHER): Payer: BC Managed Care – PPO | Admitting: Medical

## 2013-08-11 ENCOUNTER — Encounter: Payer: Self-pay | Admitting: Medical

## 2013-08-11 VITALS — BP 132/80 | HR 78 | Temp 97.9°F | Resp 14 | Wt 291.0 lb

## 2013-08-11 DIAGNOSIS — M7552 Bursitis of left shoulder: Secondary | ICD-10-CM

## 2013-08-11 DIAGNOSIS — M719 Bursopathy, unspecified: Secondary | ICD-10-CM

## 2013-08-11 DIAGNOSIS — M79602 Pain in left arm: Secondary | ICD-10-CM

## 2013-08-11 DIAGNOSIS — M79609 Pain in unspecified limb: Secondary | ICD-10-CM

## 2013-08-11 DIAGNOSIS — M79645 Pain in left finger(s): Secondary | ICD-10-CM

## 2013-08-11 DIAGNOSIS — M67919 Unspecified disorder of synovium and tendon, unspecified shoulder: Secondary | ICD-10-CM

## 2013-08-11 NOTE — Patient Instructions (Signed)
Thank you for giving me the opportunity to serve you today.    Your diagnosis today includes: Encounter Diagnoses  Name Primary?  . Bursitis of shoulder, left Yes  . Arm pain, left      Specific recommendations today include:  Begin Aleve OTC, twice daily for this week.  You can actually use 2 Aleve twice daily if needed.    Use ice pack to the shoulder at least at bedtime, but more often if you can for the next several day  Consider using arm sling for a hour at a time this week  Avoid lots of over head motion this week  If not seeming to get back to normal within 10-14 days, then recheck.  If much worse pain, numbness, worse tingling, neck pain, then recheck  Follow up: as needed   I have included other useful information below for your review.  Impingement Syndrome, Rotator Cuff, Bursitis with Rehab Impingement syndrome is a condition that involves inflammation of the tendons of the rotator cuff and the subacromial bursa, that causes pain in the shoulder. The rotator cuff consists of four tendons and muscles that control much of the shoulder and upper arm function. The subacromial bursa is a fluid filled sac that helps reduce friction between the rotator cuff and one of the bones of the shoulder (acromion). Impingement syndrome is usually an overuse injury that causes swelling of the bursa (bursitis), swelling of the tendon (tendonitis), and/or a tear of the tendon (strain). Strains are classified into three categories. Grade 1 strains cause pain, but the tendon is not lengthened. Grade 2 strains include a lengthened ligament, due to the ligament being stretched or partially ruptured. With grade 2 strains there is still function, although the function may be decreased. Grade 3 strains include a complete tear of the tendon or muscle, and function is usually impaired. SYMPTOMS   Pain around the shoulder, often at the outer portion of the upper arm.  Pain that gets worse with  shoulder function, especially when reaching overhead or lifting.  Sometimes, aching when not using the arm.  Pain that wakes you up at night.  Sometimes, tenderness, swelling, warmth, or redness over the affected area.  Loss of strength.  Limited motion of the shoulder, especially reaching behind the back (to the back pocket or to unhook bra) or across your body.  Crackling sound (crepitation) when moving the arm.  Biceps tendon pain and inflammation (in the front of the shoulder). Worse when bending the elbow or lifting. CAUSES  Impingement syndrome is often an overuse injury, in which chronic (repetitive) motions cause the tendons or bursa to become inflamed. A strain occurs when a force is paced on the tendon or muscle that is greater than it can withstand. Common mechanisms of injury include: Stress from sudden increase in duration, frequency, or intensity of training.  Direct hit (trauma) to the shoulder.  Aging, erosion of the tendon with normal use.  Bony bump on shoulder (acromial spur). RISK INCREASES WITH:  Contact sports (football, wrestling, boxing).  Throwing sports (baseball, tennis, volleyball).  Weightlifting and bodybuilding.  Heavy labor.  Previous injury to the rotator cuff, including impingement.  Poor shoulder strength and flexibility.  Failure to warm up properly before activity.  Inadequate protective equipment.  Old age.  Bony bump on shoulder (acromial spur). PREVENTION   Warm up and stretch properly before activity.  Allow for adequate recovery between workouts.  Maintain physical fitness:  Strength, flexibility, and endurance.  Cardiovascular  fitness.  Learn and use proper exercise technique. PROGNOSIS  If treated properly, impingement syndrome usually goes away within 6 weeks. Sometimes surgery is required.  RELATED COMPLICATIONS   Longer healing time if not properly treated, or if not given enough time to heal.  Recurring  symptoms, that result in a chronic condition.  Shoulder stiffness, frozen shoulder, or loss of motion.  Rotator cuff tendon tear.  Recurring symptoms, especially if activity is resumed too soon, with overuse, with a direct blow, or when using poor technique. TREATMENT  Treatment first involves the use of ice and medicine, to reduce pain and inflammation. The use of strengthening and stretching exercises may help reduce pain with activity. These exercises may be performed at home or with a therapist. If non-surgical treatment is unsuccessful after more than 6 months, surgery may be advised. After surgery and rehabilitation, activity is usually possible in 3 months.  MEDICATION  If pain medicine is needed, nonsteroidal anti-inflammatory medicines (aspirin and ibuprofen), or other minor pain relievers (acetaminophen), are often advised.  Do not take pain medicine for 7 days before surgery.  Prescription pain relievers may be given, if your caregiver thinks they are needed. Use only as directed and only as much as you need.  Corticosteroid injections may be given by your caregiver. These injections should be reserved for the most serious cases, because they may only be given a certain number of times. HEAT AND COLD  Cold treatment (icing) should be applied for 10 to 15 minutes every 2 to 3 hours for inflammation and pain, and immediately after activity that aggravates your symptoms. Use ice packs or an ice massage.  Heat treatment may be used before performing stretching and strengthening activities prescribed by your caregiver, physical therapist, or athletic trainer. Use a heat pack or a warm water soak. SEEK MEDICAL CARE IF:   Symptoms get worse or do not improve in 4 to 6 weeks, despite treatment.  New, unexplained symptoms develop. (Drugs used in treatment may produce side effects.)

## 2013-08-11 NOTE — Progress Notes (Signed)
   Subjective:   Gina Lopez is a 58 y.o. female presenting on 08/11/2013 with left arm pain starting at the shoulder on down  She says the left arm has been acting up for 1- 2 week.  She notes pains that started in left shoulder, coming down lateral arm.  The other day tingling down left arm.   Pain is intermittent.  Denies neck pain.  Denies weakness of the arm.  Denies swelling of the arm or hands.  No bony changes.   No recent injury, fall or trauma.   Works in [employer].  Has had some posterior shoulder pains in the past, but no chronic issues.  Pain is 3/10.  Using Aleve 1 weekly prn.  No other aggravating or relieving factors.   She also has concern for growth of left small finger.  Cut the tip of the finger almost off in work accident in the past, but in the last several months feels thicker growing area of the top of the finger that bothers her.  No other complaint.  Review of Systems Denies weakness, numbness, fevers, weight loss No CP, SOB, DOE. No swelling of extremities.      Objective:     Filed Vitals:   08/11/13 1437  BP: 132/80  Pulse: 78  Temp: 97.9 F (36.6 C)  Resp: 14    General appearance: alert, no distress, WD/WN MSK: Mild tenderness of left a.c. joint, otherwise nontender, mild pain with overhead range of motion, no laxity, negative special tests no deformity. Left fifth small finger distal phalanx dorsal service with thickened raised lump, mildly tender Neck: Supple, nontender, normal range of motion, no mass Arms neurovascularly intact     Xray of left small finger - no obvious bony deformity at area of raised lump of left 5th finger distal phalanx, no other abnormality on xray   Assessment: Encounter Diagnoses  Name Primary?  . Bursitis of shoulder, left Yes  . Arm pain, left   . Finger pain, left      Plan: Bursitis, arm pain - mild.   We will treat conservatively with ice, over-the-counter Aleve twice daily, relative rest, periodic use  of arm sling, no overhead lifting for a week.  Finger pain-x-ray today reviewed.  Lump likely represents scar tissue or ganglion cyst or other cyst.   Referral at her request back to hand surgeon.   [name] was seen today for left arm pain starting at the shoulder on down.  Diagnoses and associated orders for this visit:  Bursitis of shoulder, left  Arm pain, left     Return if symptoms worsen or fail to improve.

## 2013-09-24 ENCOUNTER — Other Ambulatory Visit: Payer: Self-pay | Admitting: Family Medicine

## 2013-10-05 ENCOUNTER — Telehealth: Payer: Self-pay | Admitting: Medical

## 2013-10-05 ENCOUNTER — Other Ambulatory Visit: Payer: Self-pay | Admitting: Family Medicine

## 2013-10-05 DIAGNOSIS — I10 Essential (primary) hypertension: Secondary | ICD-10-CM

## 2013-10-05 MED ORDER — LISINOPRIL-HYDROCHLOROTHIAZIDE 10-12.5 MG PO TABS: 1.0000 | ORAL_TABLET | Freq: Every day | ORAL | Status: DC

## 2013-10-05 NOTE — Telephone Encounter (Signed)
RX REFILL SENT TO HER PHARMACY. CLS

## 2013-10-12 ENCOUNTER — Encounter: Payer: Self-pay | Admitting: Medical

## 2013-10-12 ENCOUNTER — Other Ambulatory Visit: Payer: Self-pay | Admitting: Medical

## 2013-10-12 ENCOUNTER — Other Ambulatory Visit (HOSPITAL_COMMUNITY)
Admission: RE | Admit: 2013-10-12 | Discharge: 2013-10-12 | Disposition: A | Discharge status: Home / Self Care | Payer: BC Managed Care – PPO | Source: Ambulatory Visit | Attending: Medical | Admitting: Medical

## 2013-10-12 ENCOUNTER — Ambulatory Visit (INDEPENDENT_AMBULATORY_CARE_PROVIDER_SITE_OTHER): Payer: BC Managed Care – PPO | Admitting: Medical

## 2013-10-12 VITALS — BP 112/80 | HR 78 | Temp 97.6°F | Resp 16 | Ht 67.2 in | Wt 284.0 lb

## 2013-10-12 DIAGNOSIS — Z1151 Encounter for screening for human papillomavirus (HPV): Secondary | ICD-10-CM | POA: Insufficient documentation

## 2013-10-12 DIAGNOSIS — Z124 Encounter for screening for malignant neoplasm of cervix: Secondary | ICD-10-CM

## 2013-10-12 DIAGNOSIS — Z23 Encounter for immunization: Secondary | ICD-10-CM

## 2013-10-12 DIAGNOSIS — Z1211 Encounter for screening for malignant neoplasm of colon: Secondary | ICD-10-CM

## 2013-10-12 DIAGNOSIS — E669 Obesity, unspecified: Secondary | ICD-10-CM

## 2013-10-12 DIAGNOSIS — N6310 Unspecified lump in the right breast, unspecified quadrant: Secondary | ICD-10-CM

## 2013-10-12 DIAGNOSIS — Z1239 Encounter for other screening for malignant neoplasm of breast: Secondary | ICD-10-CM

## 2013-10-12 DIAGNOSIS — N63 Unspecified lump in unspecified breast: Secondary | ICD-10-CM

## 2013-10-12 DIAGNOSIS — Z01419 Encounter for gynecological examination (general) (routine) without abnormal findings: Secondary | ICD-10-CM | POA: Insufficient documentation

## 2013-10-12 DIAGNOSIS — I10 Essential (primary) hypertension: Secondary | ICD-10-CM

## 2013-10-12 DIAGNOSIS — Z0000 Encounter for general adult medical examination without abnormal findings: Secondary | ICD-10-CM

## 2013-10-12 LAB — POCT URINALYSIS DIPSTICK
BILIRUBIN UA: NEGATIVE
Blood, UA: NEGATIVE
Glucose, UA: NEGATIVE
Ketones, UA: NEGATIVE
LEUKOCYTES UA: NEGATIVE
NITRITE UA: NEGATIVE
PH UA: 5
Spec Grav, UA: 1.02
UROBILINOGEN UA: NEGATIVE

## 2013-10-12 LAB — CBC WITH DIFFERENTIAL/PLATELET
BASOS PCT: 1 % (ref 0–1)
Basophils Absolute: 0 10*3/uL (ref 0.0–0.1)
EOS ABS: 0.2 10*3/uL (ref 0.0–0.7)
Eosinophils Relative: 5 % (ref 0–5)
HCT: 36.8 % (ref 36.0–46.0)
HEMOGLOBIN: 11.7 g/dL — AB (ref 12.0–15.0)
LYMPHS ABS: 1.7 10*3/uL (ref 0.7–4.0)
Lymphocytes Relative: 38 % (ref 12–46)
MCH: 24.8 pg — AB (ref 26.0–34.0)
MCHC: 31.8 g/dL (ref 30.0–36.0)
MCV: 78 fL (ref 78.0–100.0)
MONO ABS: 0.5 10*3/uL (ref 0.1–1.0)
MONOS PCT: 11 % (ref 3–12)
Neutro Abs: 2 10*3/uL (ref 1.7–7.7)
Neutrophils Relative %: 45 % (ref 43–77)
Platelets: 205 10*3/uL (ref 150–400)
RBC: 4.72 MIL/uL (ref 3.87–5.11)
RDW: 15.5 % (ref 11.5–15.5)
WBC: 4.5 10*3/uL (ref 4.0–10.5)

## 2013-10-12 NOTE — Patient Instructions (Signed)
  Thank you for giving me the opportunity to serve you today.    Your diagnosis today includes: Encounter Diagnoses  Name Primary?  . Routine general medical examination at a health care facility Yes  . Essential hypertension, benign   . Need for Tdap vaccination   . Screening for breast cancer   . Special screening for malignant neoplasms, colon   . Screening for cervical cancer   . HTN (hypertension)   . Breast lump   . Obesity, unspecified      Specific recommendations today include:  I recommend a Tdap vaccine, however we are out of them today.  Nurse will call you when they're in  Hypertension-pending labs, likely continue same medications.  Your blood pressure is well controlled  We will refer you back for mammogram, right diagnostic, left screening  I recommend weight loss through healthy diet and exercise changes  See your eye doctor and dentist yearly  We will call with labs and Pap smear result  If still anemic, will likely go ahead and refer for colonoscopy update  Return pending labs.

## 2013-10-12 NOTE — Progress Notes (Signed)
Subjective:   HPI  Gina Lopez is a 58 y.o. female who presents for a complete physical.  Was seeing urgent care prior for routine care.  Medical care team includes:  Dr. Debara Pickett, cardiology 2013  [facility]  Sees dentist  Dorothea Ogle, PA-C, primary care here   Preventative care: Last ophthalmology visit:yes - [facility] - last eye exam 08/2013 Last dental visit:needs a new dentist Last colonoscopy:7 years ago Last mammogram:10/2012 Last gynecological exam:3 years ago Last EKG:yes Last labs:08/2012  Prior vaccinations: TD or Tdap:n/a Influenza:yes Pneumococcal:n/a Shingles/Zostavax:n/a  Advanced directive:n/a Health care power of attorney:n/a Living will:n/a  Concerns: Lump under right armpit and throat congestion.  Reviewed their medical, surgical, family, social, medication, and allergy history and updated chart as appropriate.  Past Medical History  Diagnosis Date  . Hypertension   . Obesity   . Anemia   . Wears partial dentures     upper only  . Allergy     environmental in spring  . History of echocardiogram 02/22/12    mild concentric LVH, EF 55%, left atrial mildly dilated, Dr. Debara Pickett  . Lymphedema of leg     L>R, sees vein clinic, uses compression pump device at home daily; [facility]  . Heart murmur     benign, 2013 echo    Past Surgical History  Procedure Laterality Date  . Finger surgery      reattachment of tip of pinky finger on left hand  . Colonoscopy      age 63yo due to blood in stool, [place]    History   Social History  . Marital Status: Single    Spouse Name: N/A    Number of Children: N/A  . Years of Education: N/A   Occupational History  . Not on file.   Social History Main Topics  . Smoking status: Never Smoker   . Smokeless tobacco: Not on file  . Alcohol Use: No  . Drug Use: No  . Sexual Activity: Not Currently   Other Topics Concern  . Not on file   Social History Narrative   Son lives with her  at times, son in school at [employer].   Single, no significant other as of 10/2013.  Exercise - yard work, mowing, walking, mowing as a side job.  Works as Public affairs consultant.    Family History  Problem Relation Age of Onset  . Arthritis Mother   . Hypertension Mother   . Obesity Mother   . Hip fracture Mother   . Alcohol abuse Father   . Diabetes Brother   . Heart disease Neg Hx   . Stroke Neg Hx   . Cancer Sister     breast    Current outpatient prescriptions:furosemide (LASIX) 20 MG tablet, Take 1 tablet (20 mg total) by mouth 2 (two) times daily., Disp: 90 tablet, Rfl: 3;  hydrOXYzine (ATARAX/VISTARIL) 10 MG tablet, Take 1 tablet (10 mg total) by mouth 3 (three) times daily as needed for itching., Disp: 30 tablet, Rfl: 0;  lisinopril-hydrochlorothiazide (PRINZIDE,ZESTORETIC) 10-12.5 MG per tablet, Take 1 tablet by mouth daily., Disp: 90 tablet, Rfl: 0 potassium chloride SA (K-DUR,KLOR-CON) 20 MEQ tablet, Take 1 tablet (20 mEq total) by mouth daily., Disp: 90 tablet, Rfl: 3  Allergies  Allergen Reactions  . Codeine Other (See Comments)    Pt states it makes her feel dizzy and stay in her system forever  . Terbinafine And Related Hives and Rash  Review of Systems Constitutional: -fever, -chills, -sweats, -unexpected weight change, -decreased appetite, -fatigue Allergy: -sneezing, -itching, -congestion Dermatology: -changing moles, --rash, +lumps(under arm pit) ENT: -runny nose, -ear pain, -sore throat, -hoarseness, -sinus pain, -teeth pain, - ringing in ears, -hearing loss, -nosebleeds Cardiology: -chest pain, -palpitations, -swelling, -difficulty breathing when lying flat, -waking up short of breath Respiratory: +cough, -shortness of breath, -difficulty breathing with exercise or exertion, -wheezing, -coughing up blood Gastroenterology: -abdominal pain, -nausea, -vomiting, -diarrhea, -constipation, -blood in stool, -changes in bowel movement, -difficulty swallowing or  eating Hematology: -bleeding, -bruising  Musculoskeletal: -joint aches, -muscle aches, +joint swelling, -back pain, -neck pain, -cramping, -changes in gait Ophthalmology: denies vision changes, eye redness, itching, discharge Urology: -burning with urination, -difficulty urinating, -blood in urine, -urinary frequency, -urgency, -incontinence Neurology: -headache, -weakness, -tingling, -numbness, -memory loss, -falls, -dizziness Psychology: -depressed mood, -agitation, -sleep problems     Objective:   Physical Exam  BP 112/80  Pulse 78  Temp(Src) 97.6 F (36.4 C) (Oral)  Resp 16  Ht 5' 7.2" (1.707 m)  Wt 284 lb (128.822 kg)  BMI 44.21 kg/m2  General appearance: alert, no distress, WD/WN, obese AA female Skin: few small skin tags of neck/right chest under breast, few scattered benign macules, right breast 9 o'clock with 61mm flat brown macule, no worrisome lesions HEENT: normocephalic, conjunctiva/corneas normal, sclerae anicteric, PERRLA, EOMi, nares patent, no discharge or erythema, pharynx normal Oral cavity: MMM, tongue normal, teeth - denture upper, lower in good repair Neck: supple, no lymphadenopathy, no thyromegaly, no masses, normal ROM, no bruits Chest: non tender, normal shape and expansion Heart: RRR, normal S1, S2, no murmurs Lungs: CTA bilaterally, no wheezes, rhonchi, or rales Abdomen: +bs, soft, non tender, non distended, no masses, no hepatomegaly, no splenomegaly, no bruits Back: non tender, normal ROM, no scoliosis Musculoskeletal: upper extremities non tender, no obvious deformity, normal ROM throughout, lower extremities non tender, no obvious deformity, normal ROM throughout Extremities: lymphedema of LE, L>R, otherwise no cyanosis, no clubbing Pulses: 2+ symmetric, upper and lower extremities, normal cap refill Neurological: alert, oriented x 3, CN2-12 intact, strength normal upper extremities and lower extremities, sensation normal throughout, DTRs 2+  throughout, no cerebellar signs, gait normal Psychiatric: normal affect, behavior normal, pleasant  Breast: nontender, small 40mm nodule right breast 9'oclock area, small 45mm oval nodules in right axilla that may be lymph nodes vs cystic lesion, otherwise no masses or lumps, no skin changes, no nipple discharge or inversion, no axillary lymphadenopathy Gyn: Normal external genitalia without lesions, vagina with normal mucosa, cervix without lesions, no cervical motion tenderness, no abnormal vaginal discharge.  Uterus and adnexa not enlarged, nontender, no masses.  Pap performed.  Exam chaperoned by nurse. Rectal: anus normal tone, no nodules, occult negative stool    Assessment and Plan :    Encounter Diagnoses  Name Primary?  . Routine general medical examination at a health care facility Yes  . Essential hypertension, benign   . Need for Tdap vaccination   . Screening for breast cancer   . Special screening for malignant neoplasms, colon   . Screening for cervical cancer   . HTN (hypertension)   . Breast lump   . Obesity, unspecified      Physical exam - discussed healthy lifestyle, diet, exercise, preventative care, vaccinations, and addressed their concerns.  Handout given.  Specific recommendations today include:  I recommend a Tdap vaccine, however we are out of them today.  Nurse will call you when they're in  Hypertension-pending labs, likely  continue same medications.  Your blood pressure is well controlled  We will refer you back for mammogram, right diagnostic, left screening  I recommend weight loss through healthy diet and exercise changes  See your eye doctor and dentist yearly  We will call with labs and Pap smear result  If still anemic, will likely go ahead and refer for colonoscopy update  Follow-up pending labs

## 2013-10-13 LAB — LIPID PANEL
CHOL/HDL RATIO: 3 ratio
CHOLESTEROL: 146 mg/dL (ref 0–200)
HDL: 48 mg/dL (ref >39)
LDL CALC: 86 mg/dL (ref 0–99)
Triglycerides: 60 mg/dL (ref <150)
VLDL: 12 mg/dL (ref 0–40)

## 2013-10-13 LAB — COMPREHENSIVE METABOLIC PANEL
ALBUMIN: 4.2 g/dL (ref 3.5–5.2)
ALT: 17 U/L (ref 0–35)
AST: 14 U/L (ref 0–37)
Alkaline Phosphatase: 124 U/L — ABNORMAL HIGH (ref 39–117)
BILIRUBIN TOTAL: 0.6 mg/dL (ref 0.2–1.2)
BUN: 12 mg/dL (ref 6–23)
CO2: 28 mEq/L (ref 19–32)
Calcium: 9.1 mg/dL (ref 8.4–10.5)
Chloride: 103 mEq/L (ref 96–112)
Creat: 0.77 mg/dL (ref 0.50–1.10)
Glucose, Bld: 88 mg/dL (ref 70–99)
POTASSIUM: 3.6 meq/L (ref 3.5–5.3)
Sodium: 141 mEq/L (ref 135–145)
Total Protein: 7.3 g/dL (ref 6.0–8.3)

## 2013-10-13 LAB — TSH: TSH: 0.674 u[IU]/mL (ref 0.350–4.500)

## 2013-10-16 ENCOUNTER — Other Ambulatory Visit: Payer: Self-pay

## 2013-10-16 ENCOUNTER — Other Ambulatory Visit: Payer: Self-pay | Admitting: Medical

## 2013-10-16 DIAGNOSIS — N6310 Unspecified lump in the right breast, unspecified quadrant: Secondary | ICD-10-CM

## 2013-10-20 ENCOUNTER — Ambulatory Visit
Admission: RE | Admit: 2013-10-20 | Discharge: 2013-10-20 | Disposition: A | Discharge status: Home / Self Care | Payer: BC Managed Care – PPO | Source: Ambulatory Visit | Attending: Medical | Admitting: Medical

## 2013-10-20 DIAGNOSIS — N6310 Unspecified lump in the right breast, unspecified quadrant: Secondary | ICD-10-CM

## 2013-10-29 ENCOUNTER — Other Ambulatory Visit (INDEPENDENT_AMBULATORY_CARE_PROVIDER_SITE_OTHER): Payer: BC Managed Care – PPO

## 2013-10-29 DIAGNOSIS — Z23 Encounter for immunization: Secondary | ICD-10-CM

## 2013-11-18 ENCOUNTER — Other Ambulatory Visit: Payer: Self-pay | Admitting: Medical

## 2013-11-18 DIAGNOSIS — R922 Inconclusive mammogram: Secondary | ICD-10-CM

## 2013-11-18 DIAGNOSIS — R9230 Dense breasts, unspecified: Secondary | ICD-10-CM

## 2013-11-27 ENCOUNTER — Ambulatory Visit
Admission: RE | Admit: 2013-11-27 | Discharge: 2013-11-27 | Disposition: A | Discharge status: Home / Self Care | Payer: BC Managed Care – PPO | Source: Ambulatory Visit | Attending: Medical | Admitting: Medical

## 2013-11-27 ENCOUNTER — Encounter (INDEPENDENT_AMBULATORY_CARE_PROVIDER_SITE_OTHER): Payer: Self-pay

## 2013-11-27 ENCOUNTER — Other Ambulatory Visit: Payer: Self-pay | Admitting: Medical

## 2013-11-27 DIAGNOSIS — R922 Inconclusive mammogram: Secondary | ICD-10-CM

## 2013-11-27 DIAGNOSIS — R9230 Dense breasts, unspecified: Secondary | ICD-10-CM

## 2013-11-27 DIAGNOSIS — N6310 Unspecified lump in the right breast, unspecified quadrant: Secondary | ICD-10-CM

## 2014-02-01 ENCOUNTER — Other Ambulatory Visit: Payer: Self-pay | Admitting: Medical

## 2014-04-16 ENCOUNTER — Encounter: Payer: Self-pay | Admitting: Gastroenterology

## 2014-07-16 ENCOUNTER — Telehealth: Payer: Self-pay | Admitting: Internal Medicine

## 2014-07-16 ENCOUNTER — Other Ambulatory Visit: Payer: Self-pay | Admitting: Family Medicine

## 2014-07-16 MED ORDER — LISINOPRIL-HYDROCHLOROTHIAZIDE 10-12.5 MG PO TABS: 1.0000 | ORAL_TABLET | Freq: Every day | ORAL | Status: DC

## 2014-07-16 NOTE — Telephone Encounter (Signed)
MEDICATION REFILL WAS SENT

## 2014-07-16 NOTE — Telephone Encounter (Signed)
Pt needs a refill on lisinopril-hctz to [facility]

## 2014-08-09 ENCOUNTER — Encounter: Payer: Self-pay | Admitting: Podiatry

## 2014-08-09 ENCOUNTER — Ambulatory Visit (INDEPENDENT_AMBULATORY_CARE_PROVIDER_SITE_OTHER): Payer: BLUE CROSS/BLUE SHIELD

## 2014-08-09 ENCOUNTER — Ambulatory Visit (INDEPENDENT_AMBULATORY_CARE_PROVIDER_SITE_OTHER): Payer: BLUE CROSS/BLUE SHIELD | Admitting: Podiatry

## 2014-08-09 VITALS — BP 132/86 | HR 72 | Resp 12

## 2014-08-09 DIAGNOSIS — M79671 Pain in right foot: Secondary | ICD-10-CM

## 2014-08-09 DIAGNOSIS — M7661 Achilles tendinitis, right leg: Secondary | ICD-10-CM

## 2014-08-09 MED ORDER — DICLOFENAC SODIUM 75 MG PO TBEC: 75.0000 mg | DELAYED_RELEASE_TABLET | Freq: Two times a day (BID) | ORAL | Status: DC

## 2014-08-09 MED ORDER — TRIAMCINOLONE ACETONIDE 10 MG/ML IJ SUSP
10.0000 mg | Freq: Once | INTRAMUSCULAR | Status: AC
  Administered 2014-08-09: 10 mg

## 2014-08-09 NOTE — Progress Notes (Signed)
   Subjective:    Patient ID: Gina Lopez, female    DOB: Aug 24, 1955, 59 y.o.   MRN: [id number]  Foot Pain  PT STATED RT FOOT BACK OF THE HEEL IS BEEN HURTING FOR 2 WEEKS. THE FOOT HAVE GOOD/BAD DAYS AND GET AGGRAVATED BY WEARING SHOES, AND WHEN OUT OF THE BED. TRIED TAKING ALEVE IT HELP SOME.    Review of Systems  All other systems reviewed and are negative.      Objective:   Physical Exam        Assessment & Plan:

## 2014-08-09 NOTE — Patient Instructions (Signed)

## 2014-08-10 NOTE — Progress Notes (Signed)
Subjective:     Patient ID: Gina Lopez, female   DOB: 06-Jul-1955, 59 y.o.   MRN: [id number]  HPI patient states I've had a lot of pain in the back of my right heel which has gradually gotten worse over the last few weeks and is making it very difficult for me to walk or sleep when I put my heel against the bed   Review of Systems  All other systems reviewed and are negative.      Objective:   Physical Exam  Constitutional: She is oriented to person, place, and time.  Cardiovascular: Intact distal pulses.   Musculoskeletal: Normal range of motion.  Neurological: She is oriented to person, place, and time.  Skin: Skin is warm.  Nursing note and vitals reviewed.  neurovascular status intact with muscle strength adequate and range of motion subtalar midtarsal joint within normal limits. Patient's noted to have good digital perfusion and is well oriented 3 and is noted to have a tremendous amount of discomfort posterior lateral aspect of the right heel at the insertion of the Achilles. Patient's muscle strength is normal of the Achilles there is no equinus condition and I noted digits to be well perfused     Assessment:     Acute Achilles tendinitis right with inflammation    Plan:     H&P and x-ray reviewed with patient. Discussed treatment options and she wants and aggressive conservative approach and I discussed injection and possibilities for rupture associated with it. She understands risk and wants procedure and today I did a sterile prep of the lateral side of the right Achilles and then injected carefully deepened it away from the central and medial aspect of the tendon with 3 mg of dexamethasone Kenalog 5 mg of Xylocaine and applied ice to the area and instructed on reduced activity. Reappoint in 2 weeks to recheck or earlier if any issues should occur

## 2014-08-23 ENCOUNTER — Ambulatory Visit (INDEPENDENT_AMBULATORY_CARE_PROVIDER_SITE_OTHER): Payer: BLUE CROSS/BLUE SHIELD | Admitting: Podiatry

## 2014-08-23 ENCOUNTER — Encounter: Payer: Self-pay | Admitting: Podiatry

## 2014-08-23 VITALS — BP 125/65 | HR 81 | Resp 14

## 2014-08-23 DIAGNOSIS — M7661 Achilles tendinitis, right leg: Secondary | ICD-10-CM

## 2014-08-23 NOTE — Progress Notes (Signed)
   Subjective:    Patient ID: Gina Lopez, female    DOB: [birth year], 59 y.o.   MRN: [id number]  HPI  F/U  right achilles injected on 08-10-14 for pain, patient says she isn't having any pain since.  Review of Systems  All other systems reviewed and are negative.      Objective:   Physical Exam        Assessment & Plan:

## 2014-08-25 NOTE — Progress Notes (Signed)
Subjective:     Patient ID: Gina Lopez, female   DOB: Dec 02, 1955, 59 y.o.   MRN: [id number]  HPI patient states her right heel is feeling much better with diminishment of discomfort and inability to walk more comfortably now than she was previously   Review of Systems     Objective:   Physical Exam Neurovascular status intact muscle strength adequate with posterior heel right that is quite a bit better with minimal discomfort when pressed    Assessment:     Improve tendinitis of the Achilles tendon right    Plan:     Advised on physical therapy anti-inflammatory and ice therapy. Patient will be seen back as needed

## 2014-11-24 ENCOUNTER — Encounter: Payer: Self-pay | Admitting: Medical

## 2014-11-24 ENCOUNTER — Ambulatory Visit (INDEPENDENT_AMBULATORY_CARE_PROVIDER_SITE_OTHER): Payer: BLUE CROSS/BLUE SHIELD | Admitting: Medical

## 2014-11-24 VITALS — BP 132/70 | HR 76 | Temp 98.0°F | Wt 292.0 lb

## 2014-11-24 DIAGNOSIS — I10 Essential (primary) hypertension: Secondary | ICD-10-CM | POA: Diagnosis not present

## 2014-11-24 DIAGNOSIS — M7062 Trochanteric bursitis, left hip: Secondary | ICD-10-CM | POA: Diagnosis not present

## 2014-11-24 MED ORDER — DICLOFENAC SODIUM 75 MG PO TBEC: 75.0000 mg | DELAYED_RELEASE_TABLET | Freq: Two times a day (BID) | ORAL | Status: DC

## 2014-11-24 MED ORDER — LISINOPRIL-HYDROCHLOROTHIAZIDE 10-12.5 MG PO TABS: 1.0000 | ORAL_TABLET | Freq: Every day | ORAL | Status: DC

## 2014-11-24 NOTE — Progress Notes (Signed)
Subjective: Here for left leg pain x 10-12 days.  Not constant but intermittent at times when walking.  Its a grabbing pain when she walks a certain way.   In left upper thigh and outer thigh.  No swelling . She did slip down on the ground in wet mud doing gardening several weeks ago, but wasn't in pain the next week.  This pain started out of the blue.   No numbness or tingling.  She does note hx/o lymphedema in legs.  Out of BP medication, not sure if she needs to stay on potassium.  No other aggravating or relieving factors. No other complaint.  Objective: BP 132/70 mmHg  Pulse 76  Temp(Src) 98 F (36.7 C) (Oral)  Wt 292 lb (132.45 kg)  Gen: obese AA female, wd, wn, nad Skin: unremarkable Tender over left trochanteric bursa mildly, otherwise no leg tenderness, no swelling, no pain with hip or knee ROM which is full.  No leg deformity Legs neurovascularly intact Ext: generalized lymphedema of bilat lower legs.     Assessment: Encounter Diagnoses  Name Primary?  . Trochanteric bursitis of left hip Yes  . Essential hypertension     Plan: Trochanteric bursitis - discussed diagnosis, treatment, begin 7-10 days of Diclofenac, ice, avoid stairs and activity that worsen.  If not improving in 1-2 wk, consider steroid injection  HTN - refilled medication, and advised her to return soon for physical and labs.

## 2014-12-03 ENCOUNTER — Other Ambulatory Visit: Payer: Self-pay

## 2014-12-03 DIAGNOSIS — Z1231 Encounter for screening mammogram for malignant neoplasm of breast: Secondary | ICD-10-CM

## 2014-12-10 ENCOUNTER — Ambulatory Visit
Admission: RE | Admit: 2014-12-10 | Discharge: 2014-12-10 | Disposition: A | Discharge status: Home / Self Care | Payer: BLUE CROSS/BLUE SHIELD | Source: Ambulatory Visit

## 2014-12-10 DIAGNOSIS — Z1231 Encounter for screening mammogram for malignant neoplasm of breast: Secondary | ICD-10-CM

## 2015-01-02 ENCOUNTER — Ambulatory Visit (INDEPENDENT_AMBULATORY_CARE_PROVIDER_SITE_OTHER): Payer: BLUE CROSS/BLUE SHIELD | Admitting: Family Medicine

## 2015-01-02 ENCOUNTER — Ambulatory Visit (INDEPENDENT_AMBULATORY_CARE_PROVIDER_SITE_OTHER): Payer: BLUE CROSS/BLUE SHIELD

## 2015-01-02 VITALS — BP 132/84 | HR 89 | Temp 98.5°F | Resp 16 | Ht 68.5 in | Wt 289.0 lb

## 2015-01-02 DIAGNOSIS — M79645 Pain in left finger(s): Secondary | ICD-10-CM

## 2015-01-02 DIAGNOSIS — R2232 Localized swelling, mass and lump, left upper limb: Secondary | ICD-10-CM | POA: Diagnosis not present

## 2015-01-02 DIAGNOSIS — IMO0002 Reserved for concepts with insufficient information to code with codable children: Secondary | ICD-10-CM

## 2015-01-02 DIAGNOSIS — L988 Other specified disorders of the skin and subcutaneous tissue: Secondary | ICD-10-CM | POA: Diagnosis not present

## 2015-01-02 MED ORDER — AMOXICILLIN-POT CLAVULANATE 875-125 MG PO TABS: 1.0000 | ORAL_TABLET | Freq: Two times a day (BID) | ORAL | Status: DC

## 2015-01-02 NOTE — Progress Notes (Signed)
Subjective:    Patient ID: Gina Lopez, female    DOB: Mar 06, 1956, 59 y.o.   MRN: [id number]  HPI Patient presents for bump on pinky finger of left hand that has been present for several years, but has now become painful. She put apple cider vinegar on bump for several days and it began to drain. Denies fever, redness, loss of fxn/sensation/ROM, swelling, or numbness.  Positive depression screening. States that for the past month has been sadder than previously. Her job is going through [employer] and although she has been told her job is safe she is still worried. Additionally, the job is moving to a smaller location due to [place] which makes her uneasy. Additionally her son is a senior a [employer] and after his loans and grant money, she still owes money to the school. States that she is managing daily and generally doesn't feels sad until after she gets home for work. States that she is a source for co-workers to confide in so this is different for her. Is not sad everyday and is still participating in daily activities. Has never been on medication for depression and is not interested in taking any now. Does not want referral to therapist, but will think about speaking with her pastor. Has been praying about current situations.    Review of Systems  Constitutional: Negative.  Negative for fever, chills, activity change and appetite change.  Musculoskeletal: Negative for joint swelling and arthralgias.  Skin: Positive for color change (darker for past 2 weeks) and wound (scab over bump).  Neurological: Negative for dizziness and headaches.  Psychiatric/Behavioral: Positive for dysphoric mood. Negative for suicidal ideas, sleep disturbance, self-injury, decreased concentration and agitation. The patient is not nervous/anxious.        Objective:   Physical Exam  Constitutional: She is oriented to person, place, and time. She appears well-developed and well-nourished. No  distress.  Blood pressure 132/84, pulse 89, temperature 98.5 F (36.9 C), temperature source Oral, resp. rate 16, height 5' 8.5" (1.74 m), weight 289 lb (131.09 kg), SpO2 96 %.  HENT:  Head: Normocephalic and atraumatic.  Right Ear: External ear normal.  Left Ear: External ear normal.  Eyes: Conjunctivae are normal. Right eye exhibits no discharge. Left eye exhibits no discharge. No scleral icterus.  Pulmonary/Chest: Effort normal.  Neurological: She is alert and oriented to person, place, and time.  Skin: Skin is warm and dry. No rash noted. She is not diaphoretic. No erythema. No pallor.  Crusted scab over lesion of little finger. Hyperpigmented skin overlaying. Induration surrounding lesion.  Psychiatric: Her speech is normal and behavior is normal. Judgment and thought content normal. Her mood appears not anxious. Her affect is not angry, not blunt, not labile and not inappropriate. She exhibits a depressed mood.  Tearful when asked about life stressor, however, has strong religious backing and states she will trust in His plan.   [facility] reading (PRIMARY) by  Dr. Joseph Art. Soft tissue swelling. No bony abnormalities.     Assessment & Plan:  1. Finger mass, left 2. Pain of finger of left hand 3. Cyst of finger Should soak finger 3-4x daily in warm water. RTC 01/04/15. If no improvement will perform I&D. Otherwise can continue with antibiotic. Ibuprofen or tyelnol for pain. - DG Finger Little Left; Future - amoxicillin-clavulanate (AUGMENTIN) 875-125 MG per tablet; Take 1 tablet by mouth 2 (two) times daily.  Dispense: 20 tablet; Refill: 0   Stalin Gruenberg PA-C  [facility] 01/02/2015 9:35 AM

## 2015-01-04 ENCOUNTER — Ambulatory Visit (INDEPENDENT_AMBULATORY_CARE_PROVIDER_SITE_OTHER): Payer: BLUE CROSS/BLUE SHIELD | Admitting: Physician Assistant

## 2015-01-04 VITALS — BP 120/74 | HR 93 | Temp 98.2°F | Resp 20 | Ht 69.0 in | Wt 288.0 lb

## 2015-01-04 DIAGNOSIS — M6749 Ganglion, multiple sites: Secondary | ICD-10-CM

## 2015-01-04 DIAGNOSIS — IMO0002 Reserved for concepts with insufficient information to code with codable children: Secondary | ICD-10-CM

## 2015-01-04 DIAGNOSIS — L988 Other specified disorders of the skin and subcutaneous tissue: Secondary | ICD-10-CM

## 2015-01-04 NOTE — Progress Notes (Signed)
[facility] [address], [place] [address] [phone]  Date:  01/04/2015   Name:  Gina Lopez   DOB:  1955-11-19   MRN:  [id number]  PCP:  Crisoforo Oxford, PA-C    History of Present Illness:  Gina Lopez is a 59 y.o. female patient who presents to [facility] for chief complaint of 5th finger cyst follow up.  Patient has noted to have had cyst for years following a complete sever of the finger status post reattachment.  She had attempted to treat with apple cider vinegar which caused a scab and pain.  She was given Augmentin 2 days ago, at [facility].  She reports that the cyst has no improved.  She has no increased swelling, drainage, or erythema.      Patient Active Problem List   Diagnosis Date Noted  . Essential hypertension, benign 01/09/2012  . Heart murmur 01/09/2012  . Edema 01/09/2012    Past Medical History  Diagnosis Date  . Hypertension   . Obesity   . Anemia   . Wears partial dentures     upper only  . Allergy     environmental in spring  . History of echocardiogram 02/22/12    mild concentric LVH, EF 55%, left atrial mildly dilated, Dr. Debara Pickett  . Lymphedema of leg     L>R, sees vein clinic, uses compression pump device at home daily; [facility]  . Heart murmur     benign, 2013 echo    Past Surgical History  Procedure Laterality Date  . Finger surgery      reattachment of tip of pinky finger on left hand  . Colonoscopy      age 67yo due to blood in stool, [place]  . Vein ligation and stripping      [facility]    History  Substance Use Topics  . Smoking status: Never Smoker   . Smokeless tobacco: Not on file  . Alcohol Use: No    Family History  Problem Relation Age of Onset  . Arthritis Mother   . Hypertension Mother   . Obesity Mother   . Hip fracture Mother   . Alcohol abuse Father   . Diabetes Brother   . Heart disease Neg Hx   . Stroke Neg Hx   . Cancer Sister     breast    Allergies   Allergen Reactions  . Codeine Other (See Comments)    Pt states it makes her feel dizzy and stay in her system forever  . Terbinafine And Related Hives and Rash    Medication list has been reviewed and updated.  Current Outpatient Prescriptions on File Prior to Visit  Medication Sig Dispense Refill  . amoxicillin-clavulanate (AUGMENTIN) 875-125 MG per tablet Take 1 tablet by mouth 2 (two) times daily. 20 tablet 0  . diclofenac (VOLTAREN) 75 MG EC tablet Take 1 tablet (75 mg total) by mouth 2 (two) times daily. 30 tablet 0  . lisinopril-hydrochlorothiazide (PRINZIDE,ZESTORETIC) 10-12.5 MG per tablet Take 1 tablet by mouth daily. 90 tablet 0  . potassium chloride SA (K-DUR,KLOR-CON) 20 MEQ tablet Take 1 tablet (20 mEq total) by mouth daily. (Patient not taking: Reported on 11/24/2014) 90 tablet 3   No current facility-administered medications on file prior to visit.    ROS ROS otherwise unremarkable unless listed above.    Physical Examination: BP 120/74 mmHg  Pulse 93  Temp(Src) 98.2 F (36.8 C) (Oral)  Resp 20  Ht 5\' 9"  (1.753 m)  Wt 288 lb (130.636 kg)  BMI 42.51 kg/m2  SpO2 98% Ideal Body Weight: Weight in (lb) to have BMI = 25: 168.9  Physical Exam Alert cooperative, and oriented x4 in NAD.  Cyst located at left 5th distal finger along dorsum of PIP.  Normal ROM for patient and strength.    Assessment and Plan: 59 year old female with PMH listed above is here today for follow up of cyst.  This is likely a ganglion-like cyst beneath the tendon.  I have referred her to a hand specialist at [facility], as their consult is appreciated at this time.    Cyst of finger - Plan: Ambulatory referral to Hand Surgery  Cyst in hand  Ivar Drape, PA-C [facility] 01/04/2015 6:12 PM

## 2015-01-04 NOTE — Patient Instructions (Signed)
Please await contact from referral for hand surgeon.  If this does not work, please call and we will discuss other options.

## 2015-01-14 HISTORY — PX: CYST EXCISION: SHX5701

## 2015-04-29 ENCOUNTER — Ambulatory Visit (INDEPENDENT_AMBULATORY_CARE_PROVIDER_SITE_OTHER): Payer: BLUE CROSS/BLUE SHIELD | Admitting: Medical

## 2015-04-29 ENCOUNTER — Encounter: Payer: Self-pay | Admitting: Medical

## 2015-04-29 VITALS — BP 158/72 | HR 87 | Temp 98.2°F | Wt 298.0 lb

## 2015-04-29 DIAGNOSIS — I10 Essential (primary) hypertension: Secondary | ICD-10-CM | POA: Diagnosis not present

## 2015-04-29 DIAGNOSIS — R42 Dizziness and giddiness: Secondary | ICD-10-CM | POA: Diagnosis not present

## 2015-04-29 DIAGNOSIS — I517 Cardiomegaly: Secondary | ICD-10-CM | POA: Diagnosis not present

## 2015-04-29 DIAGNOSIS — R011 Cardiac murmur, unspecified: Secondary | ICD-10-CM | POA: Diagnosis not present

## 2015-04-29 LAB — BASIC METABOLIC PANEL
BUN: 12 mg/dL (ref 7–25)
CHLORIDE: 110 mmol/L (ref 98–110)
CO2: 25 mmol/L (ref 20–31)
CREATININE: 0.71 mg/dL (ref 0.50–1.05)
Calcium: 8.7 mg/dL (ref 8.6–10.4)
Glucose, Bld: 102 mg/dL — ABNORMAL HIGH (ref 65–99)
Potassium: 4 mmol/L (ref 3.5–5.3)
Sodium: 143 mmol/L (ref 135–146)

## 2015-04-29 LAB — CBC
HCT: 34.9 % — ABNORMAL LOW (ref 36.0–46.0)
Hemoglobin: 11 g/dL — ABNORMAL LOW (ref 12.0–15.0)
MCH: 25.2 pg — ABNORMAL LOW (ref 26.0–34.0)
MCHC: 31.5 g/dL (ref 30.0–36.0)
MCV: 80 fL (ref 78.0–100.0)
MPV: 11.2 fL (ref 8.6–12.4)
PLATELETS: 223 10*3/uL (ref 150–400)
RBC: 4.36 MIL/uL (ref 3.87–5.11)
RDW: 15.1 % (ref 11.5–15.5)
WBC: 9.1 10*3/uL (ref 4.0–10.5)

## 2015-04-29 MED ORDER — MECLIZINE HCL 25 MG PO TABS: 25.0000 mg | ORAL_TABLET | Freq: Two times a day (BID) | ORAL | Status: DC
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
## 2015-04-30 LAB — BRAIN NATRIURETIC PEPTIDE: Brain Natriuretic Peptide: 18.7 pg/mL (ref 0.0–100.0)

## 2015-05-09 ENCOUNTER — Encounter: Payer: Self-pay | Admitting: Medical

## 2015-05-09 ENCOUNTER — Ambulatory Visit (INDEPENDENT_AMBULATORY_CARE_PROVIDER_SITE_OTHER): Payer: BLUE CROSS/BLUE SHIELD | Admitting: Medical

## 2015-05-09 VITALS — BP 150/80 | HR 98 | Wt 297.0 lb

## 2015-05-09 DIAGNOSIS — H8113 Benign paroxysmal vertigo, bilateral: Secondary | ICD-10-CM | POA: Diagnosis not present

## 2015-05-09 DIAGNOSIS — D509 Iron deficiency anemia, unspecified: Secondary | ICD-10-CM

## 2015-05-09 DIAGNOSIS — Z1211 Encounter for screening for malignant neoplasm of colon: Secondary | ICD-10-CM | POA: Diagnosis not present

## 2015-05-09 LAB — IRON AND TIBC
%SAT: 11 % (ref 11–50)
Iron: 36 ug/dL — ABNORMAL LOW (ref 45–160)
TIBC: 342 ug/dL (ref 250–450)
UIBC: 306 ug/dL (ref 125–400)

## 2015-05-09 NOTE — Progress Notes (Signed)
Subjective: Chief Complaint  Patient presents with  . Anemia    has no other concerns   Here for recheck.  I saw her last week for vertigo and at that time we checked some labs which showed anemia.  She denies currently bleeding or bruising but does have hx/o anemia, iron deficiency.   Denies hx/o rectal bleeding, weight loss, fever, fatigue, no hx/o uterine fibroids.  No periods in several years as she is post menopausal.   The dizziness is much improved from last visit using meclizine.  No other aggravating or relieving factors. No other complaint.  Past Medical History  Diagnosis Date  . Hypertension   . Obesity   . Anemia   . Wears partial dentures     upper only  . Allergy     environmental in spring  . History of echocardiogram 02/22/12    mild concentric LVH, EF 55%, left atrial mildly dilated, Dr. Debara Pickett  . Lymphedema of leg     L>R, sees vein clinic, uses compression pump device at home daily; [facility]  . Heart murmur     benign, 2013 echo   ROS as in subjective   Objective: BP 150/80 mmHg  Pulse 98  Wt 297 lb (134.718 kg)  Gen: wd, wn, nad Heart: RRR, normal S1, s2, no murmurs Lungs clear Pulses normal Ext: no edema Abdomen: +bs, soft, nontender,no mass, no organomegaly   Assessment: Encounter Diagnoses  Name Primary?  . Iron deficiency anemia Yes  . Benign paroxysmal positional vertigo, bilateral   . Special screening for malignant neoplasms, colon     Plan: advised she find a way to get transportation to update colonoscopy as she is due.  Labs today for iron.   discussed possible causes of anemia, likely iron deficient due to poor absorption/diet.  C/t Meclizine another several days, then prn.   Return soon as planned for physical.  She will do stool hemocult cards x 3 and return these.

## 2015-05-17 ENCOUNTER — Other Ambulatory Visit (INDEPENDENT_AMBULATORY_CARE_PROVIDER_SITE_OTHER): Payer: BLUE CROSS/BLUE SHIELD

## 2015-05-17 DIAGNOSIS — K921 Melena: Secondary | ICD-10-CM

## 2015-05-17 LAB — HEMOCCULT GUIAC POC 1CARD (OFFICE)
Card #2 Fecal Occult Blod, POC: NEGATIVE
FECAL OCCULT BLD: NEGATIVE
Fecal Occult Blood, POC: POSITIVE — AB

## 2015-05-18 ENCOUNTER — Ambulatory Visit (INDEPENDENT_AMBULATORY_CARE_PROVIDER_SITE_OTHER): Payer: BLUE CROSS/BLUE SHIELD | Admitting: Medical

## 2015-05-18 ENCOUNTER — Encounter: Payer: Self-pay | Admitting: Medical

## 2015-05-18 VITALS — BP 110/70 | HR 108 | Ht 67.75 in | Wt 295.0 lb

## 2015-05-18 DIAGNOSIS — Z23 Encounter for immunization: Secondary | ICD-10-CM | POA: Diagnosis not present

## 2015-05-18 DIAGNOSIS — Z1211 Encounter for screening for malignant neoplasm of colon: Secondary | ICD-10-CM | POA: Diagnosis not present

## 2015-05-18 DIAGNOSIS — Z131 Encounter for screening for diabetes mellitus: Secondary | ICD-10-CM | POA: Insufficient documentation

## 2015-05-18 DIAGNOSIS — Z0000 Encounter for general adult medical examination without abnormal findings: Secondary | ICD-10-CM | POA: Insufficient documentation

## 2015-05-18 DIAGNOSIS — E669 Obesity, unspecified: Secondary | ICD-10-CM

## 2015-05-18 DIAGNOSIS — R195 Other fecal abnormalities: Secondary | ICD-10-CM | POA: Insufficient documentation

## 2015-05-18 DIAGNOSIS — L989 Disorder of the skin and subcutaneous tissue, unspecified: Secondary | ICD-10-CM | POA: Insufficient documentation

## 2015-05-18 DIAGNOSIS — I10 Essential (primary) hypertension: Secondary | ICD-10-CM

## 2015-05-18 DIAGNOSIS — D6489 Other specified anemias: Secondary | ICD-10-CM | POA: Insufficient documentation

## 2015-05-18 DIAGNOSIS — I890 Lymphedema, not elsewhere classified: Secondary | ICD-10-CM | POA: Insufficient documentation

## 2015-05-18 DIAGNOSIS — I517 Cardiomegaly: Secondary | ICD-10-CM

## 2015-05-18 DIAGNOSIS — R011 Cardiac murmur, unspecified: Secondary | ICD-10-CM | POA: Diagnosis not present

## 2015-05-18 DIAGNOSIS — Z124 Encounter for screening for malignant neoplasm of cervix: Secondary | ICD-10-CM | POA: Diagnosis not present

## 2015-05-18 DIAGNOSIS — Z1239 Encounter for other screening for malignant neoplasm of breast: Secondary | ICD-10-CM | POA: Insufficient documentation

## 2015-05-18 DIAGNOSIS — Z1159 Encounter for screening for other viral diseases: Secondary | ICD-10-CM

## 2015-05-18 DIAGNOSIS — E041 Nontoxic single thyroid nodule: Secondary | ICD-10-CM

## 2015-05-18 LAB — T4, FREE: Free T4: 0.89 ng/dL (ref 0.80–1.80)

## 2015-05-18 LAB — LIPID PANEL
CHOL/HDL RATIO: 2.7 ratio (ref <=5.0)
CHOLESTEROL: 146 mg/dL (ref 125–200)
HDL: 55 mg/dL (ref >=46)
LDL Cholesterol: 81 mg/dL (ref <130)
Triglycerides: 49 mg/dL (ref <150)
VLDL: 10 mg/dL (ref <30)

## 2015-05-18 LAB — TSH: TSH: 0.546 u[IU]/mL (ref 0.350–4.500)

## 2015-05-18 LAB — POCT URINALYSIS DIPSTICK
BILIRUBIN UA: NEGATIVE
Blood, UA: NEGATIVE
Glucose, UA: NEGATIVE
KETONES UA: NEGATIVE
LEUKOCYTES UA: NEGATIVE
NITRITE UA: NEGATIVE
Spec Grav, UA: >=1.03
Urobilinogen, UA: NEGATIVE
pH, UA: 6

## 2015-05-18 MED ORDER — LISINOPRIL-HYDROCHLOROTHIAZIDE 10-12.5 MG PO TABS: 1.0000 | ORAL_TABLET | Freq: Every day | ORAL | Status: DC

## 2015-05-18 NOTE — Progress Notes (Signed)
Subjective:   HPI  Gina Lopez is a 59 y.o. female who presents for a complete physical.    Medical care team includes:  Dr. Debara Pickett, cardiology 2013  [facility]  Sees dentist  [facility] surgery center  Hand surgeon  Dorothea Ogle, PA-C, primary care here  Concerns: New mole on left face.  Reviewed their medical, surgical, family, social, medication, and allergy history and updated chart as appropriate.  Past Medical History  Diagnosis Date  . Hypertension   . Obesity   . Anemia   . Wears partial dentures     upper only  . Allergy     environmental in spring  . History of echocardiogram 02/22/12    mild concentric LVH, EF 55%, left atrial mildly dilated, Dr. Debara Pickett  . Lymphedema of leg     L>R, sees vein clinic, uses compression pump device at home daily; Vein Clinic  . Heart murmur     benign, 2013 echo    Past Surgical History  Procedure Laterality Date  . Finger surgery      reattachment of tip of pinky finger on left hand  . Colonoscopy      age 89yo due to blood in stool, [place]  . Vein ligation and stripping      [facility]    Social History   Social History  . Marital Status: Single    Spouse Name: N/A  . Number of Children: N/A  . Years of Education: N/A   Occupational History  . Not on file.   Social History Main Topics  . Smoking status: Former Smoker -- 1 years  . Smokeless tobacco: Not on file  . Alcohol Use: No  . Drug Use: No  . Sexual Activity: Not Currently   Other Topics Concern  . Not on file   Social History Narrative   Son lives with her at times, son in school at [employer].   Single, no significant other as of 05/2015. Exercise - yard work, mowing, walking, mowing as a side job.  Works as Public affairs consultant.    Family History  Problem Relation Age of Onset  . Arthritis Mother   . Hypertension Mother   . Obesity Mother   . Hip fracture Mother   . Alcohol abuse Father   . Diabetes Brother   . Heart  disease Neg Hx   . Stroke Neg Hx   . Cancer Sister     breast     Current outpatient prescriptions:  .  lisinopril-hydrochlorothiazide (PRINZIDE,ZESTORETIC) 10-12.5 MG tablet, Take 1 tablet by mouth daily., Disp: 90 tablet, Rfl: 3 .  diclofenac (VOLTAREN) 75 MG EC tablet, Take 1 tablet (75 mg total) by mouth 2 (two) times daily. (Patient not taking: Reported on 05/09/2015), Disp: 30 tablet, Rfl: 0 .  meclizine (ANTIVERT) 25 MG tablet, Take 1 tablet (25 mg total) by mouth 2 (two) times daily. (Patient not taking: Reported on 05/18/2015), Disp: 30 tablet, Rfl: 0 .  potassium chloride SA (K-DUR,KLOR-CON) 20 MEQ tablet, Take 1 tablet (20 mEq total) by mouth daily. (Patient not taking: Reported on 11/24/2014), Disp: 90 tablet, Rfl: 3  Allergies  Allergen Reactions  . Codeine Other (See Comments)    Pt states it makes her feel dizzy and stay in her system forever  . Terbinafine And Related Hives and Rash    Review of Systems Constitutional: -fever, -chills, -sweats, -unexpected weight change, -decreased appetite, -fatigue Allergy: -sneezing, -itching, -congestion Dermatology: -changing moles, --  rash, +lumps(under arm pit) ENT: -runny nose, -ear pain, -sore throat, -hoarseness, -sinus pain, -teeth pain, - ringing in ears, -hearing loss, -nosebleeds Cardiology: -chest pain, -palpitations, -swelling, -difficulty breathing when lying flat, -waking up short of breath Respiratory: -cough, -shortness of breath, -difficulty breathing with exercise or exertion, -wheezing, -coughing up blood Gastroenterology: -abdominal pain, -nausea, -vomiting, -diarrhea, -constipation, -blood in stool, -changes in bowel movement, -difficulty swallowing or eating Hematology: -bleeding, -bruising  Musculoskeletal: -joint aches, -muscle aches, -joint swelling, -back pain, -neck pain, -cramping, -changes in gait Ophthalmology: denies vision changes, eye redness, itching, discharge Urology: -burning with urination,  -difficulty urinating, -blood in urine, -urinary frequency, -urgency, -incontinence Neurology: -headache, -weakness, -tingling, -numbness, -memory loss, -falls, -dizziness Psychology: -depressed mood, -agitation, -sleep problems     Objective:   Physical Exam  BP 110/70 mmHg  Pulse 108  Ht 5' 7.75" (1.721 m)  Wt 295 lb (133.811 kg)  BMI 45.18 kg/m2  General appearance: alert, no distress, WD/WN, obese AA female Skin: new 54mm round raised lesoin of left face, otherwise few small skin tags of neck/right chest under breast, few scattered benign macules, right breast 9 o'clock with 48mm flat brown macule HEENT: normocephalic, conjunctiva/corneas normal, sclerae anicteric, PERRLA, EOMi, nares patent, no discharge or erythema, pharynx normal Oral cavity: MMM, tongue normal, teeth - denture upper, lower in good repair Neck: supple, no lymphadenopathy,possible 2cm nodule left thyroid, no other masses, normal ROM, no bruits Chest: non tender, normal shape and expansion Heart: RRR, normal S1, S2, no murmurs Lungs: CTA bilaterally, no wheezes, rhonchi, or rales Abdomen: +bs, soft, non tender, non distended, no masses, no hepatomegaly, no splenomegaly, no bruits Back: non tender, normal ROM, no scoliosis Musculoskeletal: upper extremities non tender, no obvious deformity, normal ROM throughout, lower extremities non tender, no obvious deformity, normal ROM throughout Extremities: lymphedema of LE, L>R, otherwise no cyanosis, no clubbing Pulses: 2+ symmetric, upper and lower extremities, normal cap refill Neurological: alert, oriented x 3, CN2-12 intact, strength normal upper extremities and lower extremities, sensation normal throughout, DTRs 2+ throughout, no cerebellar signs, gait normal Psychiatric: normal affect, behavior normal, pleasant  Breast: nontender, no nodules, no masses or lumps, no skin changes, no nipple discharge or inversion, no axillary lymphadenopathy Gyn: Normal external  genitalia without lesions, vagina with normal mucosa, cervix without lesions, no cervical motion tenderness, no abnormal vaginal discharge.  Uterus and adnexa not enlarged, nontender, no masses.  Exam chaperoned by nurse. Rectal: deferred   Assessment and Plan :    Encounter Diagnoses  Name Primary?  . Encounter for health maintenance examination in adult Yes  . LVH (left ventricular hypertrophy)   . Essential hypertension   . Heart murmur   . Lymphedema   . Obesity   . Anemia due to other cause   . Positive occult stool blood test   . Special screening for malignant neoplasms, colon   . Screening for breast cancer   . Screening for cervical cancer   . Need for prophylactic vaccination and inoculation against influenza   . Need for hepatitis C screening test   . Skin lesion of face      Physical exam - discussed healthy lifestyle, diet, exercise, preventative care, vaccinations, and addressed their concerns.  Handout given. hypertension - c/t same medication Routine labs today Get mammogram yearly See your eye doctor yearly for routine vision care. See your dentist yearly for routine dental care including hygiene visits twice yearly. Referral for thyroid ultrasound given nodule Referral back to GI for  anemia and colonoscopy She will address dermatology referral after these other issues above Obesity - discussed need to work on healthy lifestyle, weight loss changes Counseled on the influenza virus vaccine.  Vaccine information sheet given.  Influenza vaccine given after consent obtained. Anemia - Hemoccult +, refer back to GI Follow-up pending labs

## 2015-05-19 ENCOUNTER — Telehealth: Payer: Self-pay

## 2015-05-19 ENCOUNTER — Encounter: Payer: Self-pay | Admitting: Gastroenterology

## 2015-05-19 LAB — HEMOGLOBIN A1C
Hgb A1c MFr Bld: 6.5 % — ABNORMAL HIGH (ref <5.7)
MEAN PLASMA GLUCOSE: 140 mg/dL — AB (ref <117)

## 2015-05-19 LAB — HEPATITIS C ANTIBODY: HCV Ab: NEGATIVE

## 2015-05-19 NOTE — Telephone Encounter (Signed)
[place] on 05/20/15 at 340pm pt is aware. [address]

## 2015-05-20 ENCOUNTER — Ambulatory Visit
Admission: RE | Admit: 2015-05-20 | Discharge: 2015-05-20 | Disposition: A | Discharge status: Home / Self Care | Payer: BLUE CROSS/BLUE SHIELD | Source: Ambulatory Visit | Attending: Medical | Admitting: Medical

## 2015-05-20 DIAGNOSIS — E041 Nontoxic single thyroid nodule: Secondary | ICD-10-CM

## 2015-05-23 ENCOUNTER — Other Ambulatory Visit: Payer: BLUE CROSS/BLUE SHIELD

## 2015-07-06 ENCOUNTER — Ambulatory Visit (AMBULATORY_SURGERY_CENTER): Payer: Self-pay | Admitting: *Deleted

## 2015-07-06 VITALS — Ht 67.0 in | Wt 298.0 lb

## 2015-07-06 DIAGNOSIS — Z1211 Encounter for screening for malignant neoplasm of colon: Secondary | ICD-10-CM

## 2015-07-06 MED ORDER — NA SULFATE-K SULFATE-MG SULF 17.5-3.13-1.6 GM/177ML PO SOLN: ORAL | Status: DC

## 2015-07-06 NOTE — Progress Notes (Signed)
Patient denies any allergies to eggs or soy. Patient denies any problems with anesthesia/sedation. Patient denies any oxygen use at home and does not take any diet/weight loss medications. Patient declined EMMI information today. Pt aware to hold Iron pill 5 days prior to exam.

## 2015-07-13 DIAGNOSIS — K635 Polyp of colon: Secondary | ICD-10-CM

## 2015-07-13 HISTORY — PX: COLONOSCOPY: SHX174

## 2015-07-13 HISTORY — DX: Polyp of colon: K63.5

## 2015-07-20 ENCOUNTER — Encounter: Payer: Self-pay | Admitting: Gastroenterology

## 2015-07-20 ENCOUNTER — Ambulatory Visit (AMBULATORY_SURGERY_CENTER): Payer: BLUE CROSS/BLUE SHIELD | Admitting: Gastroenterology

## 2015-07-20 VITALS — BP 139/82 | HR 65 | Temp 96.6°F | Resp 18 | Ht 67.0 in | Wt 298.0 lb

## 2015-07-20 DIAGNOSIS — Z1211 Encounter for screening for malignant neoplasm of colon: Secondary | ICD-10-CM

## 2015-07-20 DIAGNOSIS — D127 Benign neoplasm of rectosigmoid junction: Secondary | ICD-10-CM

## 2015-07-20 DIAGNOSIS — D129 Benign neoplasm of anus and anal canal: Secondary | ICD-10-CM

## 2015-07-20 DIAGNOSIS — K621 Rectal polyp: Secondary | ICD-10-CM | POA: Diagnosis not present

## 2015-07-20 DIAGNOSIS — D128 Benign neoplasm of rectum: Secondary | ICD-10-CM

## 2015-07-20 MED ORDER — SODIUM CHLORIDE 0.9 % IV SOLN: 500.0000 mL | INTRAVENOUS | Status: DC

## 2015-07-20 NOTE — Op Note (Signed)
[place]  [address] [place], [id number]   COLONOSCOPY PROCEDURE REPORT  PATIENT: Gina Lopez, Gina Lopez  MR#: [id number] BIRTHDATE: [birth year] , 47  yrs. old GENDER: female ENDOSCOPIST: Yetta Flock, MD REFERRED BY: Chana Bode PA PROCEDURE DATE:  07/20/2015 PROCEDURE:   Colonoscopy, screening and Colonoscopy with snare polypectomy First Screening Colonoscopy - Avg.  risk and is 50 yrs.  old or older - No.  Prior Negative Screening - Now for repeat screening. 10 or more years since last screening  History of Adenoma - Now for follow-up colonoscopy & has been > or = to 3 yrs.  N/A  Polyps removed today? Yes ASA CLASS:   Class III INDICATIONS:Screening for colonic neoplasia and Colorectal Neoplasm Risk Assessment for this procedure is average risk. MEDICATIONS: Propofol 200 mg IV  DESCRIPTION OF PROCEDURE:   After the risks benefits and alternatives of the procedure were thoroughly explained, informed consent was obtained.  The digital rectal exam revealed no abnormalities of the rectum.   The LB [id number]  endoscope was introduced through the anus and advanced to the cecum, which was identified by both the appendix and ileocecal valve. No adverse events experienced.   The quality of the prep was adequate  The instrument was then slowly withdrawn as the colon was fully examined. Estimated blood loss is zero unless otherwise noted in this procedure report.    COLON FINDINGS: A 22mm sessile rectal polyp was noted and removed via cold snare.  Three x 95mm sessile polyps were noted in the rectosigmoid colon and removed via cold snare.  The remainder of the examined colon was normal.  Retroflexed views revealed internal hemorrhoids. The time to cecum = 3.1 Withdrawal time = 11.1   The scope was withdrawn and the procedure completed. COMPLICATIONS: There were no immediate complications.  ENDOSCOPIC IMPRESSION: 4 small colon polyps removed from the  left colon as above Internal hemorrhoids  RECOMMENDATIONS: Await pathology results Resume diet Resume medications  eSigned:  Yetta Flock, MD 07/20/2015 10:21 AM   cc:  Chana Bode PA, the patient

## 2015-07-20 NOTE — Progress Notes (Signed)
Called to room to assist during endoscopic procedure.  Patient ID and intended procedure confirmed with present staff. Received instructions for my participation in the procedure from the performing physician.  

## 2015-07-20 NOTE — Patient Instructions (Signed)
YOU HAD AN ENDOSCOPIC PROCEDURE TODAY AT [facility]:   Refer to the procedure report that was given to you for any specific questions about what was found during the examination.  If the procedure report does not answer your questions, please call your gastroenterologist to clarify.  If you requested that your care partner not be given the details of your procedure findings, then the procedure report has been included in a sealed envelope for you to review at your convenience later.  YOU SHOULD EXPECT: Some feelings of bloating in the abdomen. Passage of more gas than usual.  Walking can help get rid of the air that was put into your GI tract during the procedure and reduce the bloating. If you had a lower endoscopy (such as a colonoscopy or flexible sigmoidoscopy) you may notice spotting of blood in your stool or on the toilet paper. If you underwent a bowel prep for your procedure, you may not have a normal bowel movement for a few days.  Please Note:  You might notice some irritation and congestion in your nose or some drainage.  This is from the oxygen used during your procedure.  There is no need for concern and it should clear up in a day or so.  SYMPTOMS TO REPORT IMMEDIATELY:   Following lower endoscopy (colonoscopy or flexible sigmoidoscopy):  Excessive amounts of blood in the stool  Significant tenderness or worsening of abdominal pains  Swelling of the abdomen that is new, acute  Fever of 100F or higher   For urgent or emergent issues, a gastroenterologist can be reached at any hour by calling [phone].   DIET: Your first meal following the procedure should be a small meal and then it is ok to progress to your normal diet. Heavy or fried foods are harder to digest and may make you feel nauseous or bloated.  Likewise, meals heavy in dairy and vegetables can increase bloating.  Drink plenty of fluids but you should avoid alcoholic beverages for 24  hours.  ACTIVITY:  You should plan to take it easy for the rest of today and you should NOT DRIVE or use heavy machinery until tomorrow (because of the sedation medicines used during the test).    FOLLOW UP: Our staff will call the number listed on your records the next business day following your procedure to check on you and address any questions or concerns that you may have regarding the information given to you following your procedure. If we do not reach you, we will leave a message.  However, if you are feeling well and you are not experiencing any problems, there is no need to return our call.  We will assume that you have returned to your regular daily activities without incident.  If any biopsies were taken you will be contacted by phone or by letter within the next 1-3 weeks.  Please call us at [phone] if you have not heard about the biopsies in 3 weeks.    SIGNATURES/CONFIDENTIALITY: You and/or your care partner have signed paperwork which will be entered into your electronic medical record.  These signatures attest to the fact that that the information above on your After Visit Summary has been reviewed and is understood.  Full responsibility of the confidentiality of this discharge information lies with you and/or your care-partner.  Await pathology results Polyp/ Hemorrhoid handout given Resume medications and diet

## 2015-07-20 NOTE — Progress Notes (Signed)
Report to PACU, RN, vss, BBS= Clear.  

## 2015-07-21 ENCOUNTER — Telehealth: Payer: Self-pay

## 2015-07-21 NOTE — Telephone Encounter (Signed)
  Follow up Call-  Call back number 07/20/2015  Post procedure Call Back phone  # [phone]  Permission to leave phone message Yes     Patient questions:  Do you have a fever, pain , or abdominal swelling? No. Pain Score  0 *  Have you tolerated food without any problems? Yes.    Have you been able to return to your normal activities? Yes.    Do you have any questions about your discharge instructions: Diet   No. Medications  No. Follow up visit  No.  Do you have questions or concerns about your Care? No.  Actions: * If pain score is 4 or above: No action needed, pain <4.

## 2015-07-26 ENCOUNTER — Encounter: Payer: Self-pay | Admitting: Gastroenterology

## 2015-10-22 ENCOUNTER — Ambulatory Visit (INDEPENDENT_AMBULATORY_CARE_PROVIDER_SITE_OTHER): Payer: BLUE CROSS/BLUE SHIELD

## 2015-10-22 ENCOUNTER — Ambulatory Visit (INDEPENDENT_AMBULATORY_CARE_PROVIDER_SITE_OTHER): Payer: BLUE CROSS/BLUE SHIELD | Admitting: Emergency Medicine

## 2015-10-22 VITALS — BP 146/80 | HR 88 | Temp 98.4°F | Resp 16 | Ht 67.0 in | Wt 294.0 lb

## 2015-10-22 DIAGNOSIS — I890 Lymphedema, not elsewhere classified: Secondary | ICD-10-CM

## 2015-10-22 DIAGNOSIS — M25562 Pain in left knee: Secondary | ICD-10-CM

## 2015-10-22 MED ORDER — DICLOFENAC SODIUM 1 % TD GEL: TRANSDERMAL | Status: DC

## 2015-10-22 NOTE — Patient Instructions (Addendum)
     IF you received an x-ray today, you will receive an invoice from [facility]. Please contact [facility] at [phone] with questions or concerns regarding your invoice.   IF you received labwork today, you will receive an invoice from [employer]. Please contact [facility] at [phone] with questions or concerns regarding your invoice.   Our billing staff will not be able to assist you with questions regarding bills from these companies.  You will be contacted with the lab results as soon as they are available. The fastest way to get your results is to activate your My Chart account. Instructions are located on the last page of this paperwork. If you have not heard from [place] regarding the results in 2 weeks, please contact this office.     Knee Effusion Knee effusion means that you have excess fluid in your knee joint. This can cause pain and swelling in your knee. This may make your knee more difficult to bend and move. That is because there is increased pain and pressure in the joint. If there is fluid in your knee, it often means that something is wrong inside your knee, such as severe arthritis, abnormal inflammation, or an infection. Another common cause of knee effusion is an injury to the knee muscles, ligaments, or cartilage. HOME CARE INSTRUCTIONS  Use crutches as directed by your health care provider.  Wear a knee brace as directed by your health care provider.  Apply ice to the swollen area:  Put ice in a plastic bag.  Place a towel between your skin and the bag.  Leave the ice on for 20 minutes, 2-3 times per day.  Keep your knee raised (elevated) when you are sitting or lying down.  Take medicines only as directed by your health care provider.  Do any rehabilitation or strengthening exercises as directed by your health care provider.  Rest your knee as directed by your health care provider. You may start doing your  normal activities again when your health care provider approves.   Keep all follow-up visits as directed by your health care provider. This is important. SEEK MEDICAL CARE IF:  You have ongoing (persistent) pain in your knee. SEEK IMMEDIATE MEDICAL CARE IF:  You have increased swelling or redness of your knee.  You have severe pain in your knee.  You have a fever.   This information is not intended to replace advice given to you by your health care provider. Make sure you discuss any questions you have with your health care provider.   Document Released: 08/18/2003 Document Revised: 06/18/2014 Document Reviewed: 01/11/2014 Elsevier Interactive Patient Education [employer].

## 2015-10-22 NOTE — Progress Notes (Addendum)
By signing my name below, I, Judithe Modest, attest that this documentation has been prepared under the direction and in the presence of Nena Jordan, MD. Electronically Signed: Judithe Modest, ER Scribe. 10/22/2015. 4:41 PM.  Chief Complaint:  Chief Complaint  Patient presents with  . Leg Pain    x 3 days    HPI: Gina Lopez is a 60 y.o. female with a past hx of significant chronic lymphedema in her bilateral lower extremities who reports to [facility] today complaining of left knee and leg pain for the last three days. She wears sport stockings normally. She works standing up. She took a Voltaren when her pain started. She has received multiple lower extremity US and they have all been normal. At baseline she has significant bilateral lower extremity swelling. Dr Renaldo Reel has done vascular surgery on her several times to close off veins in her left leg.    Past Medical History  Diagnosis Date  . Hypertension   . Obesity   . Anemia   . Wears partial dentures     upper only  . Allergy     environmental in spring  . History of echocardiogram 02/22/12    mild concentric LVH, EF 55%, left atrial mildly dilated, Dr. Debara Pickett  . Lymphedema of leg     L>R, sees vein clinic, uses compression pump device at home daily; [facility]in Clinic  . Heart murmur     benign, 2013 echo   Past Surgical History  Procedure Laterality Date  . Finger surgery      reattachment of tip of pinky finger on left hand  . Colonoscopy      age 87yo due to blood in stool, [place]  . Vein ligation and stripping      [facility]  . Cyst excision  01/14/15    finger   Social History   Social History  . Marital Status: Single    Spouse Name: N/A  . Number of Children: N/A  . Years of Education: N/A   Social History Main Topics  . Smoking status: Former Smoker -- 1 years  . Smokeless tobacco: Never Used  . Alcohol Use: No  . Drug Use: No  . Sexual Activity: Not Currently   Other Topics Concern   . None   Social History Narrative   Son lives with her at times, son in school at [place].   Single, no significant other as of 05/2015. Exercise - yard work, mowing, walking, mowing as a side job.  Works as Public affairs consultant.   Family History  Problem Relation Age of Onset  . Arthritis Mother   . Hypertension Mother   . Obesity Mother   . Hip fracture Mother   . Alcohol abuse Father   . Diabetes Brother   . Heart disease Neg Hx   . Stroke Neg Hx   . Colon cancer Neg Hx   . Stomach cancer Neg Hx   . Rectal cancer Neg Hx   . Cancer Sister     breast   Allergies  Allergen Reactions  . Codeine Other (See Comments)    Pt states it makes her feel dizzy and stay in her system forever  . Terbinafine And Related Hives and Rash   Prior to Admission medications   Medication Sig Start Date End Date Taking? Authorizing Provider  diclofenac (VOLTAREN) 75 MG EC tablet Take 1 tablet (75 mg total) by mouth 2 (two) times daily. 11/24/14  Yes  Camelia Eng Tysinger, PA-C  IRON PO Take 1 tablet by mouth daily.   Yes Historical Provider, MD  lisinopril-hydrochlorothiazide (PRINZIDE,ZESTORETIC) 10-12.5 MG tablet Take 1 tablet by mouth daily. 05/18/15  Yes Camelia Eng Tysinger, PA-C  Naproxen Sodium (ALEVE) 220 MG CAPS Take 1 capsule by mouth as needed.   Yes Historical Provider, MD  meclizine (ANTIVERT) 25 MG tablet Take 1 tablet (25 mg total) by mouth 2 (two) times daily. Patient not taking: Reported on 05/18/2015 04/29/15   Camelia Eng Tysinger, PA-C  potassium chloride SA (K-DUR,KLOR-CON) 20 MEQ tablet Take 1 tablet (20 mEq total) by mouth daily. Patient not taking: Reported on 11/24/2014 08/23/12   Robyn Haber, MD     ROS: The patient denies fevers, chills, night sweats, unintentional weight loss, chest pain, palpitations, wheezing, dyspnea on exertion, nausea, vomiting, abdominal pain, dysuria, hematuria, melena, numbness, weakness, or tingling.   All other systems have been reviewed and were otherwise  negative with the exception of those mentioned in the HPI and as above.    PHYSICAL EXAM: Filed Vitals:   10/22/15 1615 10/22/15 1630  BP: 166/94 146/80  Pulse: 88   Temp: 98.4 F (36.9 C)   Resp: 16    Body mass index is 46.04 kg/(m^2).  On recheck her BP was 146/80.  General: Alert, no acute distress HEENT:  Normocephalic, atraumatic, oropharynx patent. Eye: [facility] Cardiovascular:  Regular rate and rhythm, no rubs murmurs or gallops.  No Carotid bruits, radial pulse intact. 3+ swelling on right leg, 4+ on left Respiratory: Clear to auscultation bilaterally.  No wheezes, rales, or rhonchi.  No cyanosis, no use of accessory musculature Abdominal: No organomegaly, abdomen is soft and non-tender, positive bowel sounds.  No masses. Musculoskeletal: Gait intact. No edema, tenderness. With flexion of the left knee there is a bulge present superior and lateral to the knee. No warmth, redness or calf tenderness.  Skin: No rashes. Neurologic: Facial musculature symmetric. Psychiatric: Patient acts appropriately throughout our interaction. Lymphatic: No cervical or submandibular lymphadenopathy  LABS: Dg Knee Complete 4 Views Left  10/22/2015  CLINICAL DATA:  Acute left knee pain. EXAM: LEFT KNEE - COMPLETE 4+ VIEW COMPARISON:  None. FINDINGS: There is a knee joint effusion. There is joint space narrowing medially with marginal osteophytes. Mild lateral joint space narrowing at the patellofemoral joint. No evidence of fracture or focal lesion. IMPRESSION: Joint effusion. Medial compartment more than patellofemoral compartment osteoarthritis. Electronically Signed   By: Nelson Chimes M.D.   On: 10/22/2015 17:28     EKG/XRAY:   Primary read interpreted by Dr. Everlene Farrier at [facility].   ASSESSMENT/PLAN: We'll treat with Voltaren gel. Referral made to orthopedics so knee could be aspirated and possibly injectedI also placed a referral to Dr. Grafton Folk office again and vascular so she could  have a repeat ultrasound of that leg. All previous ultrasounds have been negative for DVT..I personally performed the services described in this documentation, which was scribed in my presence. The recorded information has been reviewed and is accurate.   Gross sideeffects, risk and benefits, and alternatives of medications d/w patient. Patient is aware that all medications have potential sideeffects and we are unable to predict every sideeffect or drug-drug interaction that may occur.  Arlyss Queen MD 10/22/2015 4:41 PM

## 2015-10-27 ENCOUNTER — Telehealth: Payer: Self-pay

## 2015-10-27 NOTE — Telephone Encounter (Signed)
PA completed for diclofenac gel on covermymeds. Pt has tried naproxen, mobic and diclofenac tabs prev. She has hx of + occult blood test.

## 2015-10-27 NOTE — Telephone Encounter (Signed)
PA approved through 06/10/2038. Notified pharm. 

## 2015-11-28 ENCOUNTER — Telehealth: Payer: Self-pay | Admitting: Medical

## 2015-11-28 DIAGNOSIS — E041 Nontoxic single thyroid nodule: Secondary | ICD-10-CM

## 2015-11-28 NOTE — Telephone Encounter (Signed)
Yes, please set her up repeat thyroid ultrasound due to thyroid nodules.  After the prior ultrasound we advised repeat US in 4-6 mo.

## 2015-11-28 NOTE — Telephone Encounter (Signed)
Appt made for 12/01/15 @ 1pm pt is aware

## 2015-11-28 NOTE — Telephone Encounter (Signed)
Pt called and states that she needs a referral to the [facility] for another ultrasound for a thyroid exam. States Audelia Acton told her she was suppose to get one 6 months from last December. Pt can be reached at [phone] (M)

## 2015-11-28 NOTE — Telephone Encounter (Signed)
See msg

## 2015-12-01 ENCOUNTER — Ambulatory Visit
Admission: RE | Admit: 2015-12-01 | Discharge: 2015-12-01 | Disposition: A | Discharge status: Home / Self Care | Payer: BLUE CROSS/BLUE SHIELD | Source: Ambulatory Visit | Attending: Medical | Admitting: Medical

## 2015-12-09 ENCOUNTER — Ambulatory Visit: Payer: BLUE CROSS/BLUE SHIELD | Admitting: Medical

## 2015-12-16 ENCOUNTER — Other Ambulatory Visit: Payer: Self-pay | Admitting: Medical

## 2015-12-16 ENCOUNTER — Encounter: Payer: Self-pay | Admitting: Medical

## 2015-12-16 ENCOUNTER — Ambulatory Visit (INDEPENDENT_AMBULATORY_CARE_PROVIDER_SITE_OTHER): Payer: BLUE CROSS/BLUE SHIELD | Admitting: Medical

## 2015-12-16 VITALS — BP 140/90 | HR 78 | Wt 291.0 lb

## 2015-12-16 DIAGNOSIS — D6489 Other specified anemias: Secondary | ICD-10-CM | POA: Diagnosis not present

## 2015-12-16 DIAGNOSIS — E669 Obesity, unspecified: Secondary | ICD-10-CM | POA: Diagnosis not present

## 2015-12-16 DIAGNOSIS — I10 Essential (primary) hypertension: Secondary | ICD-10-CM

## 2015-12-16 DIAGNOSIS — R7301 Impaired fasting glucose: Secondary | ICD-10-CM | POA: Diagnosis not present

## 2015-12-16 DIAGNOSIS — Z1231 Encounter for screening mammogram for malignant neoplasm of breast: Secondary | ICD-10-CM

## 2015-12-16 LAB — CBC
HCT: 35.9 % (ref 35.0–45.0)
HEMOGLOBIN: 11 g/dL — AB (ref 11.7–15.5)
MCH: 25 pg — AB (ref 27.0–33.0)
MCHC: 30.6 g/dL — AB (ref 32.0–36.0)
MCV: 81.6 fL (ref 80.0–100.0)
MPV: 11.7 fL (ref 7.5–12.5)
Platelets: 246 10*3/uL (ref 140–400)
RBC: 4.4 MIL/uL (ref 3.80–5.10)
RDW: 15.4 % — AB (ref 11.0–15.0)
WBC: 8.2 10*3/uL (ref 4.0–10.5)

## 2015-12-16 MED ORDER — LISINOPRIL-HYDROCHLOROTHIAZIDE 20-25 MG PO TABS: 1.0000 | ORAL_TABLET | Freq: Every day | ORAL | Status: DC

## 2015-12-16 NOTE — Progress Notes (Signed)
Subjective: Chief Complaint  Patient presents with  . Follow-up    on pre diabetes. has improved her diet.    Here for f/u on borderline diabetes and hypertension.  Since last visit working on [employer], exercising.  Home BPs have been in 139/80 range.  However only lost 2 lb since last visit.    Hx/o anemia - denies bleeding, bruising, fatigue.   Had updated colonoscopy 2/ 2017 with Dr. Havery Moros.   No other aggravating or relieving factors. No other complaint.  Past Medical History  Diagnosis Date  . Hypertension   . Obesity   . Anemia   . Wears partial dentures     upper only  . Allergy     environmental in spring  . History of echocardiogram 02/22/12    mild concentric LVH, EF 55%, left atrial mildly dilated, Dr. Debara Pickett  . Lymphedema of leg     L>R, sees vein clinic, uses compression pump device at home daily; [facility]in Clinic  . Heart murmur     benign, 2013 echo    Past Surgical History  Procedure Laterality Date  . Finger surgery      reattachment of tip of pinky finger on left hand  . Colonoscopy      age 73yo due to blood in stool, [place]  . Vein ligation and stripping      [facility]  . Cyst excision  01/14/15    finger     ROS as in subjective  Objective BP 140/90 mmHg  Pulse 78  Wt 291 lb (131.997 kg)  BP Readings from Last 3 Encounters:  12/16/15 140/90  10/22/15 146/80  07/20/15 139/82   Wt Readings from Last 3 Encounters:  12/16/15 291 lb (131.997 kg)  10/22/15 294 lb (133.358 kg)  07/20/15 298 lb (135.172 kg)   Gen: wd, wn, nad Heart: RRR, normal S1, S2, no murmurs Lungs clear Ext: 1+ nonpitting edema Pulses WNL    Assessment: Encounter Diagnoses  Name Primary?  . Essential hypertension Yes  . Anemia due to other cause   . Obesity   . Impaired fasting blood sugar     Plan: Glad to hear she is making diet and exercise changes.   Labs today.  Reviewed 07/2015 colonoscopy report.  Dicussed diet, exercise efforts,  medications.  Increase from Lisinopril HCT 10/12.5mg  to Lisinopril HCT 20/25 mg daily.  F/u pending labs    Gina Lopez was seen today for follow-up.  Diagnoses and all orders for this visit:  Essential hypertension  Anemia due to other cause -     CBC -     Iron  Obesity -     Hemoglobin A1c -     Glucose, Random  Impaired fasting blood sugar -     Hemoglobin A1c -     Glucose, Random  Other orders -     lisinopril-hydrochlorothiazide (PRINZIDE,ZESTORETIC) 20-25 MG tablet; Take 1 tablet by mouth daily.

## 2015-12-17 LAB — HEMOGLOBIN A1C
HEMOGLOBIN A1C: 6.3 % — AB (ref <5.7)
Mean Plasma Glucose: 134 mg/dL

## 2015-12-17 LAB — IRON: Iron: 40 ug/dL — ABNORMAL LOW (ref 45–160)

## 2015-12-17 LAB — GLUCOSE, RANDOM: Glucose, Bld: 86 mg/dL (ref 65–99)

## 2015-12-19 ENCOUNTER — Other Ambulatory Visit: Payer: Self-pay | Admitting: Medical

## 2015-12-19 MED ORDER — FERROUS FUM-IRON POLYSACCH-FA 162-115.2-1 MG PO CAPS: 1.0000 | ORAL_CAPSULE | Freq: Every day | ORAL | Status: DC

## 2015-12-23 ENCOUNTER — Ambulatory Visit
Admission: RE | Admit: 2015-12-23 | Discharge: 2015-12-23 | Disposition: A | Discharge status: Home / Self Care | Payer: BLUE CROSS/BLUE SHIELD | Source: Ambulatory Visit | Attending: Medical | Admitting: Medical

## 2015-12-23 DIAGNOSIS — Z1231 Encounter for screening mammogram for malignant neoplasm of breast: Secondary | ICD-10-CM

## 2015-12-26 ENCOUNTER — Telehealth: Payer: Self-pay | Admitting: Medical

## 2015-12-26 ENCOUNTER — Other Ambulatory Visit: Payer: Self-pay | Admitting: Medical

## 2015-12-26 MED ORDER — POLYSACCHARIDE IRON COMPLEX 150 MG PO CAPS: 150.0000 mg | ORAL_CAPSULE | Freq: Every day | ORAL | Status: DC

## 2015-12-26 NOTE — Telephone Encounter (Signed)
Pt states Iron pills not covered by insurance and cost is $60 wants to know if there is something cheaper or a generic & please send to [facility]

## 2015-12-26 NOTE — Telephone Encounter (Signed)
I sent a different alternative called Nu Iron.  Let me know if this one too expensive.

## 2015-12-27 NOTE — Telephone Encounter (Signed)
Called pharmacy and Nu Iron is $24 for 90 days, called pt and informed

## 2016-03-27 ENCOUNTER — Encounter: Payer: Self-pay | Admitting: Medical

## 2016-03-27 ENCOUNTER — Ambulatory Visit (INDEPENDENT_AMBULATORY_CARE_PROVIDER_SITE_OTHER): Payer: BLUE CROSS/BLUE SHIELD | Admitting: Medical

## 2016-03-27 VITALS — BP 130/80 | HR 76 | Resp 18 | Wt 286.2 lb

## 2016-03-27 DIAGNOSIS — D509 Iron deficiency anemia, unspecified: Secondary | ICD-10-CM

## 2016-03-27 DIAGNOSIS — M79675 Pain in left toe(s): Secondary | ICD-10-CM | POA: Diagnosis not present

## 2016-03-27 DIAGNOSIS — Z23 Encounter for immunization: Secondary | ICD-10-CM

## 2016-03-27 DIAGNOSIS — B351 Tinea unguium: Secondary | ICD-10-CM

## 2016-03-27 DIAGNOSIS — M79674 Pain in right toe(s): Secondary | ICD-10-CM

## 2016-03-27 DIAGNOSIS — I10 Essential (primary) hypertension: Secondary | ICD-10-CM

## 2016-03-27 MED ORDER — EFINACONAZOLE 10 % EX SOLN: 1.0000 "application " | Freq: Every day | CUTANEOUS | 1 refills | Status: DC

## 2016-03-27 NOTE — Progress Notes (Signed)
Subjective: Chief Complaint  Patient presents with  . Follow-up    wants blood pressure and iron checked.    Here for f/u.  Last visit in July we increased her BP medication dose and she is doing fine, BPs looking fine.  No chest pain, SOB, dyspnea, edema.  She continues on iron daily for iron deficiency anemia.  Had colonoscopy 07/2015, has no periods, no bleeding or bruising, no fatigue.    Lately having some pain in right great toe and 2nd right toe.  Pain shoot through top through nail  No other c/o   Past Medical History:  Diagnosis Date  . Allergy    environmental in spring  . Anemia   . Heart murmur    benign, 2013 echo  . History of echocardiogram 02/22/12   mild concentric LVH, EF 55%, left atrial mildly dilated, Dr. Debara Pickett  . Hypertension   . Lymphedema of leg    L>R, sees vein clinic, uses compression pump device at home daily; Vein Clinic  . Obesity   . Wears partial dentures    upper only   Past Surgical History:  Procedure Laterality Date  . COLONOSCOPY     age 84yo due to blood in stool, [place]  . COLONOSCOPY  07/2015  . CYST EXCISION  01/14/15   finger  . FINGER SURGERY     reattachment of tip of pinky finger on left hand  . VEIN LIGATION AND STRIPPING     [facility]   Current Outpatient Prescriptions on File Prior to Visit  Medication Sig Dispense Refill  . iron polysaccharides (NU-IRON) 150 MG capsule Take 1 capsule (150 mg total) by mouth daily. 90 capsule 0  . lisinopril-hydrochlorothiazide (PRINZIDE,ZESTORETIC) 20-25 MG tablet Take 1 tablet by mouth daily. 90 tablet 1   No current facility-administered medications on file prior to visit.     ROS as in subjective   Objective: BP 130/80   Pulse 76   Resp 18   Wt 286 lb 3.2 oz (129.8 kg)   BMI 44.83 kg/m  BP Readings from Last 3 Encounters:  03/27/16 130/80  12/16/15 140/90  10/22/15 (!) 146/80   Wt Readings from Last 3 Encounters:  03/27/16 286 lb 3.2 oz (129.8 kg)   12/16/15 291 lb (132 kg)  10/22/15 294 lb (133.4 kg)   General appearance: alert, no distress, WD/WN,  Neck: supple, no lymphadenopathy, no thyromegaly, no masses, no bruits Heart: RRR, normal S1, S2, no murmurs Lungs: CTA bilaterally, no wheezes, rhonchi, or rales Ext: no edema Pulses: 2+ symmetric, upper and lower extremities, normal cap refill Toes nontender, but right great toenail is smaller than other toenails, seems depressed into toe as if prior trauma and deformity.   Toenails are brownish, thicken in general of both great toenails , 2nd toenails Feet otherwise nontender, normal ROM, neuro normal, cap refill normal   Assessment: Encounter Diagnoses  Name Primary?  . Essential hypertension Yes  . Iron deficiency anemia, unspecified iron deficiency anemia type   . Need for prophylactic vaccination and inoculation against influenza   . Toe pain, bilateral   . Onychomycosis     Plan: HTN - c/t same medication, at goal Iron deficiency anemia - labs today, c/t iron, reviewed 07/2015 colonoscopy report Counseled on the influenza virus vaccine.  Vaccine information sheet given.  Influenza vaccine given after consent obtained. Begin Jublia for onychomycosis.  If this not covered by insurance, will need to f/u with podiatry  Kior was  seen today for follow-up.  Diagnoses and all orders for this visit:  Essential hypertension  Iron deficiency anemia, unspecified iron deficiency anemia type -     CBC with Differential/Platelet -     Iron and TIBC -     Ferritin -     Folate -     Vitamin B12  Need for prophylactic vaccination and inoculation against influenza -     Flu Vaccine QUAD 36+ mos PF IM (Fluarix & Fluzone Quad PF)  Toe pain, bilateral  Onychomycosis  Other orders -     Efinaconazole (JUBLIA) 10 % SOLN; Apply 1 application topically daily.

## 2016-03-28 ENCOUNTER — Other Ambulatory Visit: Payer: Self-pay | Admitting: Medical

## 2016-03-28 LAB — CBC WITH DIFFERENTIAL/PLATELET
Basophils Absolute: 85 cells/uL (ref 0–200)
Basophils Relative: 1 %
EOS ABS: 255 {cells}/uL (ref 15–500)
Eosinophils Relative: 3 %
HEMATOCRIT: 36.6 % (ref 35.0–45.0)
HEMOGLOBIN: 11.4 g/dL — AB (ref 11.7–15.5)
Lymphocytes Relative: 34 %
Lymphs Abs: 2890 cells/uL (ref 850–3900)
MCH: 25.7 pg — AB (ref 27.0–33.0)
MCHC: 31.1 g/dL — AB (ref 32.0–36.0)
MCV: 82.4 fL (ref 80.0–100.0)
MPV: 11.7 fL (ref 7.5–12.5)
Monocytes Absolute: 510 cells/uL (ref 200–950)
Monocytes Relative: 6 %
NEUTROS ABS: 4760 {cells}/uL (ref 1500–7800)
Neutrophils Relative %: 56 %
Platelets: 260 10*3/uL (ref 140–400)
RBC: 4.44 MIL/uL (ref 3.80–5.10)
RDW: 14.8 % (ref 11.0–15.0)
WBC: 8.5 10*3/uL (ref 4.0–10.5)

## 2016-03-28 LAB — VITAMIN B12: VITAMIN B 12: 269 pg/mL (ref 200–1100)

## 2016-03-28 LAB — IRON AND TIBC
%SAT: 15 % (ref 11–50)
IRON: 51 ug/dL (ref 45–160)
TIBC: 338 ug/dL (ref 250–450)
UIBC: 287 ug/dL (ref 125–400)

## 2016-03-28 LAB — FOLATE: Folate: 11.4 ng/mL (ref >5.4)

## 2016-03-28 LAB — FERRITIN: FERRITIN: 49 ng/mL (ref 10–232)

## 2016-03-28 MED ORDER — POLYSACCHARIDE IRON COMPLEX 150 MG PO CAPS: 150.0000 mg | ORAL_CAPSULE | Freq: Every day | ORAL | 1 refills | Status: DC

## 2016-03-28 MED ORDER — LISINOPRIL-HYDROCHLOROTHIAZIDE 20-25 MG PO TABS: 1.0000 | ORAL_TABLET | Freq: Every day | ORAL | 1 refills | Status: DC

## 2016-04-01 ENCOUNTER — Telehealth: Payer: Self-pay | Admitting: Family Medicine

## 2016-04-01 NOTE — Telephone Encounter (Signed)
P.A. JUBLIA

## 2016-04-02 NOTE — Telephone Encounter (Signed)
[employer] called for additional info, OV were faxed

## 2016-04-08 NOTE — Telephone Encounter (Signed)
Jublia denied, only can be approved when the fungus is proven by lab tests, KOH preporation, fungal culture or nail biopsy.  Do you want to bring her in for lab testing?

## 2016-04-09 NOTE — Telephone Encounter (Signed)
Have her bring in some toenail clippings, particular of both great toes in sterile urine cup if possible.   I'll send these for culture

## 2016-04-10 NOTE — Telephone Encounter (Signed)
Called patient Gina Lopez is not able to cut her toenails , Gina Lopez wants to come to get shane to look at her toes.Made her a appt to come in on 04/11/16 @ 3pm

## 2016-04-11 ENCOUNTER — Encounter: Payer: Self-pay | Admitting: Medical

## 2016-04-11 ENCOUNTER — Ambulatory Visit (INDEPENDENT_AMBULATORY_CARE_PROVIDER_SITE_OTHER): Payer: BLUE CROSS/BLUE SHIELD | Admitting: Medical

## 2016-04-11 VITALS — BP 122/80 | HR 84 | Wt 285.0 lb

## 2016-04-11 DIAGNOSIS — B351 Tinea unguium: Secondary | ICD-10-CM | POA: Diagnosis not present

## 2016-04-11 DIAGNOSIS — M79675 Pain in left toe(s): Secondary | ICD-10-CM | POA: Diagnosis not present

## 2016-04-11 DIAGNOSIS — M79674 Pain in right toe(s): Secondary | ICD-10-CM | POA: Diagnosis not present

## 2016-04-11 NOTE — Progress Notes (Signed)
Subjective: Chief Complaint  Patient presents with  . look toe nails    look a toe nails    Here for nail culture.  Insurance wouldn't cover jublia without culture.  She got hives with Lamisil/terbinafine.  She already tried OTC anti-fungal creams.  Has thickened brown toenails that don't grow out correctly as they did in the past, toenail discomfort, and malformed toenails.  Past Medical History:  Diagnosis Date  . Allergy    environmental in spring  . Anemia   . Heart murmur    benign, 2013 echo  . History of echocardiogram 02/22/12   mild concentric LVH, EF 55%, left atrial mildly dilated, Dr. Debara Pickett  . Hypertension   . Lymphedema of leg    L>R, sees vein clinic, uses compression pump device at home daily; [facility] Clinic  . Obesity   . Wears partial dentures    upper only   ROS as in subjective   Objective: BP 122/80   Pulse 84   Wt 285 lb (129.3 kg)   SpO2 95%   BMI 44.64 kg/m   Gen: wd, wn, and Toe nails are not grown out much, but throughout toenails appear brownish more than expected, although she is AA, nails are thickened, there is cheesy material under great toenails, and several toenails are abnormally curved   Assessment Encounter Diagnoses  Name Primary?  . Toe pain, bilateral Yes  . Onychomycosis     Plan: Limited toenail clippings were sent, and we will hope this will be enough specimen for culture.   She failed Lamisil due to allergy, failed OTC anti-fungals.   Culture sent

## 2016-04-15 ENCOUNTER — Telehealth: Payer: Self-pay | Admitting: Medical

## 2016-04-15 NOTE — Telephone Encounter (Signed)
P.A. JUBLIA, waiting culture results

## 2016-05-10 NOTE — Telephone Encounter (Signed)
Still no final results,  Checking with lab tech for results

## 2016-05-11 LAB — CULT, FUNGUS, SKIN,HAIR,NAIL W/KOH

## 2016-05-17 ENCOUNTER — Telehealth: Payer: Self-pay | Admitting: Medical

## 2016-05-17 NOTE — Telephone Encounter (Signed)
P.A. JUBLIA resubmitted with new lab results

## 2016-05-24 ENCOUNTER — Other Ambulatory Visit: Payer: BLUE CROSS/BLUE SHIELD

## 2016-05-24 NOTE — Telephone Encounter (Signed)
P.A. Denied again, states approved only when fungus is proven by lab tests or when 3 months of treatment of oral terbinafine or itraconazole as well as 48 weeks with topical ciclopirox solution

## 2016-05-28 ENCOUNTER — Other Ambulatory Visit: Payer: Self-pay

## 2016-05-28 ENCOUNTER — Other Ambulatory Visit: Payer: BLUE CROSS/BLUE SHIELD

## 2016-05-28 DIAGNOSIS — L608 Other nail disorders: Secondary | ICD-10-CM

## 2016-06-19 NOTE — Progress Notes (Signed)
Gina Lopez please review copy of denial in your folder and see how you want to proceed

## 2016-06-19 NOTE — Telephone Encounter (Signed)
Audelia Acton I put copy of denial in your folder to review.  Please advise

## 2016-06-19 NOTE — Telephone Encounter (Signed)
The lab tests proved fungal growth, see results.

## 2016-06-27 LAB — CULT, FUNGUS, SKIN,HAIR,NAIL W/KOH

## 2016-06-28 ENCOUNTER — Telehealth: Payer: Self-pay | Admitting: Medical

## 2016-06-28 ENCOUNTER — Encounter: Payer: Self-pay | Admitting: Medical

## 2016-06-28 NOTE — Progress Notes (Signed)
See telephone call

## 2016-06-28 NOTE — Telephone Encounter (Signed)
Appeal letter typed  

## 2016-06-28 NOTE — Telephone Encounter (Signed)
Spent 40 minutes on phone with [employer], prior authorization departments and provider department and appeal department.  Must send letter to fax # [phone] with documentation and also need written letter of consent from patient that we can appeal this on her behalf.

## 2016-06-29 NOTE — Telephone Encounter (Signed)
Printed permission form from [place] and called pt and advised need signature & she will call on Monday with fax # to send to her

## 2016-07-03 NOTE — Telephone Encounter (Signed)
Recv'd signed permission from pt for [place] to file appeal on her behalf.  This and appeal letter was faxed to [facility]

## 2016-07-24 NOTE — Telephone Encounter (Signed)
Finally received approval for Jublia, called pt and informed, called pharmacy & went thru for $40

## 2016-07-25 NOTE — Telephone Encounter (Signed)
Appeal was approved

## 2016-08-10 ENCOUNTER — Ambulatory Visit (INDEPENDENT_AMBULATORY_CARE_PROVIDER_SITE_OTHER): Payer: BLUE CROSS/BLUE SHIELD | Admitting: Family Medicine

## 2016-08-10 ENCOUNTER — Encounter: Payer: Self-pay | Admitting: Family Medicine

## 2016-08-10 VITALS — BP 140/82 | HR 98 | Temp 97.8°F | Wt 293.2 lb

## 2016-08-10 DIAGNOSIS — R51 Headache: Secondary | ICD-10-CM

## 2016-08-10 DIAGNOSIS — M79672 Pain in left foot: Secondary | ICD-10-CM | POA: Diagnosis not present

## 2016-08-10 DIAGNOSIS — R519 Headache, unspecified: Secondary | ICD-10-CM

## 2016-08-10 DIAGNOSIS — G8929 Other chronic pain: Secondary | ICD-10-CM

## 2016-08-10 MED ORDER — LISINOPRIL-HYDROCHLOROTHIAZIDE 20-25 MG PO TABS: 1.0000 | ORAL_TABLET | Freq: Every day | ORAL | 0 refills | Status: DC

## 2016-08-10 NOTE — Patient Instructions (Signed)
As we discussed, your symptoms are vague. I recommend you keep an eye on this and let us know if you notice any worsening pain or new symptoms. You may want to elevate your head at night and see if this helps.

## 2016-08-10 NOTE — Progress Notes (Signed)
Subjective:    Patient ID: Gina Lopez, female    DOB: 1956/01/31, 61 y.o.   MRN: [id number]  HPI Chief Complaint  Patient presents with  . neck pain    side pain at night when laying down.- 2 weeks been going on  . foot pain    left foot pain. been going on a couple months   She is here as a walk in patient today with complaints of a one week history of left sided facial pain that occurs when laying on her left side at night and was preceded by a one week history of the same issue on her right side. States right sided pain resolved when it moved to the left side. Pain is described as a "pressure" and is not present when she is sitting upright or walking. Denies any discomfort at all during the daytime.   States she has a history of dental issues and has some "bad" teeth on the bottom that need repaired. Has an upper plate. Denies tooth sensitivity to hot or cold or pain with chewing.   Denies fever, chills, headaches, dizziness, ringing or abnormal noises in the ears, vision changes, ear pain or fullness, rhinorrhea, sinus pressure or nasal congestion, jaw pain, sore throat, neck pain, dysphagia, chest pain, palpitations, shortness of breath, orthopnea, cough, back pain, nausea, vomiting, or diarrhea. Denies numbness, tingling, weakness or difficulty with speech.   She also has complaints of ongoing left lateral foot pain for the past several months. States pain is only present with ambulation and resolves with rest. No known injury. She has tried wearing different shoes. Denies redness, swelling, or tenderness.  States pain is not getting worse. She has not taken anything for the pain.   Reviewed allergies, medications, past medical, surgical and social history.    Review of Systems Pertinent positives and negatives in the history of present illness.     Objective:   Physical Exam  Constitutional: She is oriented to person, place, and time. She appears well-developed and  well-nourished. She does not have a sickly appearance. No distress.  HENT:  Right Ear: Hearing, tympanic membrane and ear canal normal.  Left Ear: Hearing, tympanic membrane and ear canal normal.  Nose: Nose normal. Right sinus exhibits no maxillary sinus tenderness and no frontal sinus tenderness. Left sinus exhibits no maxillary sinus tenderness and no frontal sinus tenderness.  Mouth/Throat: Uvula is midline, oropharynx is clear and moist and mucous membranes are normal. She has dentures. Dental caries present.  No temporal artery fullness or tenderness. No mastoid tenderness. Non tender TMJ bilaterally. No popping or clicking of TMJ.   Multiple dental caries to lower teeth without tenderness to palpation. No gum swelling or lesions.  Face with normal sensation and ROM.   Eyes: Conjunctivae, EOM and lids are normal. Pupils are equal, round, and reactive to light.  Neck: Trachea normal, normal range of motion and full passive range of motion without pain. Neck supple. Normal carotid pulses and no JVD present. Carotid bruit is not present. No thyromegaly present.  Cardiovascular: Normal rate, regular rhythm and normal pulses.   Pulmonary/Chest: Effort normal and breath sounds normal.  Musculoskeletal:       Left foot: Normal.  No calf tenderness  Lymphadenopathy:       Head (right side): No preauricular, no posterior auricular and no occipital adenopathy present.       Head (left side): No preauricular, no posterior auricular and no occipital adenopathy present.  She has no cervical adenopathy.       Right: No supraclavicular adenopathy present.       Left: No supraclavicular adenopathy present.  Neurological: She is alert and oriented to person, place, and time. She has normal strength. No cranial nerve deficit or sensory deficit. Coordination and gait normal.  Skin: Skin is warm and dry. No rash noted. She is not diaphoretic. No pallor.  Psychiatric: She has a normal mood and affect. Her  speech is normal and behavior is normal. Thought content normal.   BP 140/82   Pulse 98   Temp 97.8 F (36.6 C) (Oral)   Wt 293 lb 3.2 oz (133 kg)   BMI 45.92 kg/m        Assessment & Plan:  Left facial pain  Chronic foot pain, left  Discussed that her symptoms are nonspecific and her exam is unremarkable for both the facial and foot complaints which speaks to this being nothing serious or life threatening. Advised her to do watchful waiting and if she notices any new or worsening symptoms to call or return. Discussed that I can tell her more so as to what is not causing her symptoms than what is causing them.  She may call if foot pain worsens and request a podiatry referral.  She will follow up with Audelia Acton, PCP, for chronic health conditions.

## 2016-12-13 ENCOUNTER — Other Ambulatory Visit: Payer: Self-pay | Admitting: Medical

## 2016-12-13 DIAGNOSIS — Z1231 Encounter for screening mammogram for malignant neoplasm of breast: Secondary | ICD-10-CM

## 2016-12-24 ENCOUNTER — Ambulatory Visit
Admission: RE | Admit: 2016-12-24 | Discharge: 2016-12-24 | Disposition: A | Discharge status: Home / Self Care | Payer: BLUE CROSS/BLUE SHIELD | Source: Ambulatory Visit | Attending: Medical | Admitting: Medical

## 2016-12-24 DIAGNOSIS — Z1231 Encounter for screening mammogram for malignant neoplasm of breast: Secondary | ICD-10-CM

## 2017-02-06 IMAGING — MG MM DIGITAL SCREENING BILAT
4 series · 4 of 4 positions shown · non-contrast
Comparison: Previous exam(s).

CLINICAL DATA: Screening.

EXAM:
DIGITAL SCREENING BILATERAL MAMMOGRAM WITH CAD

[L MLO]
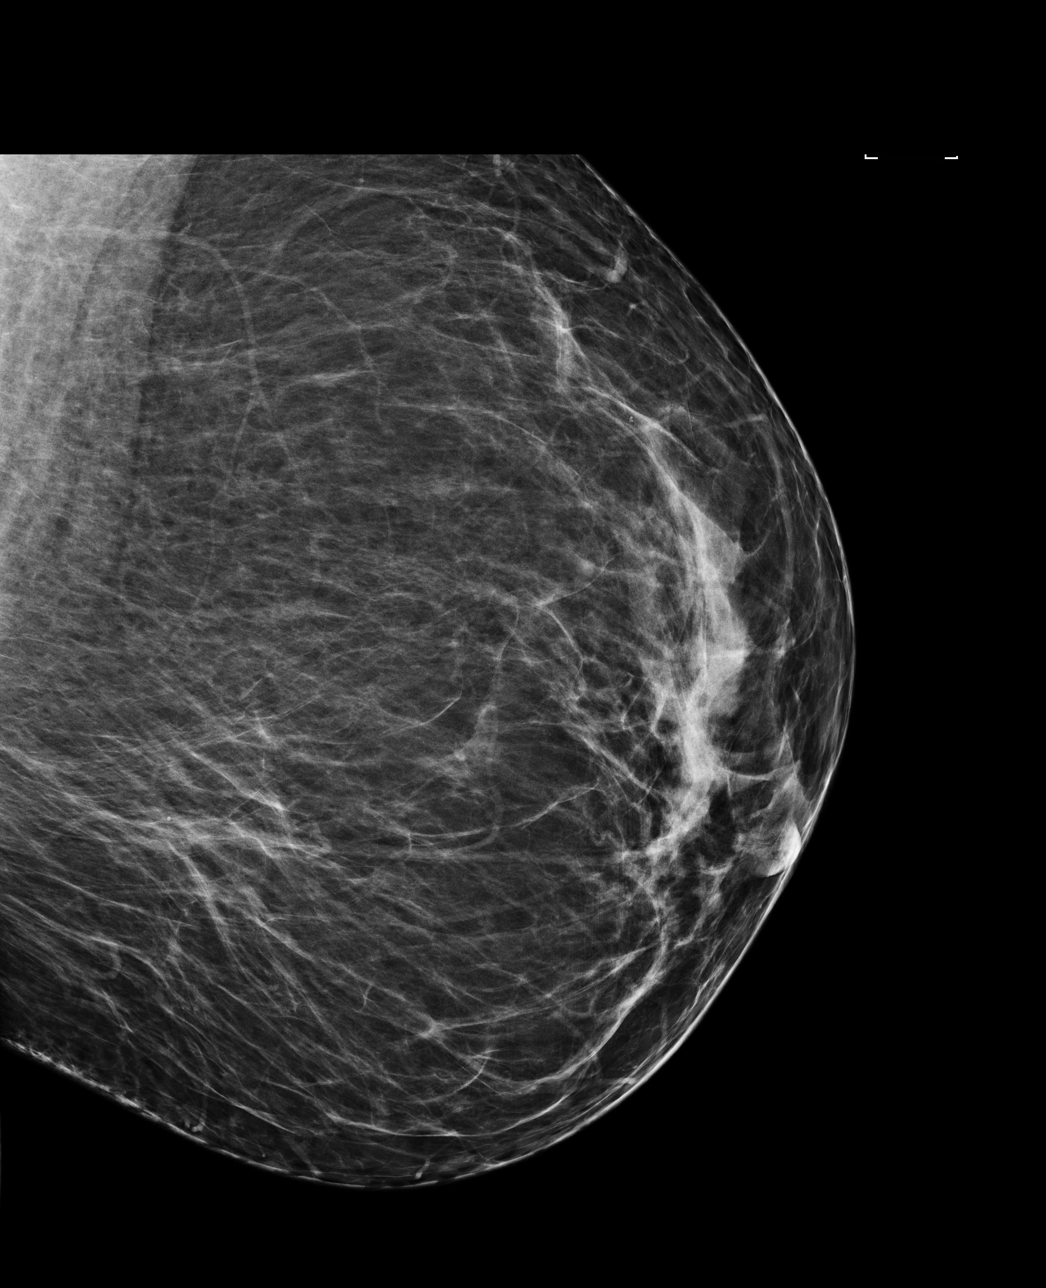

[L CC]
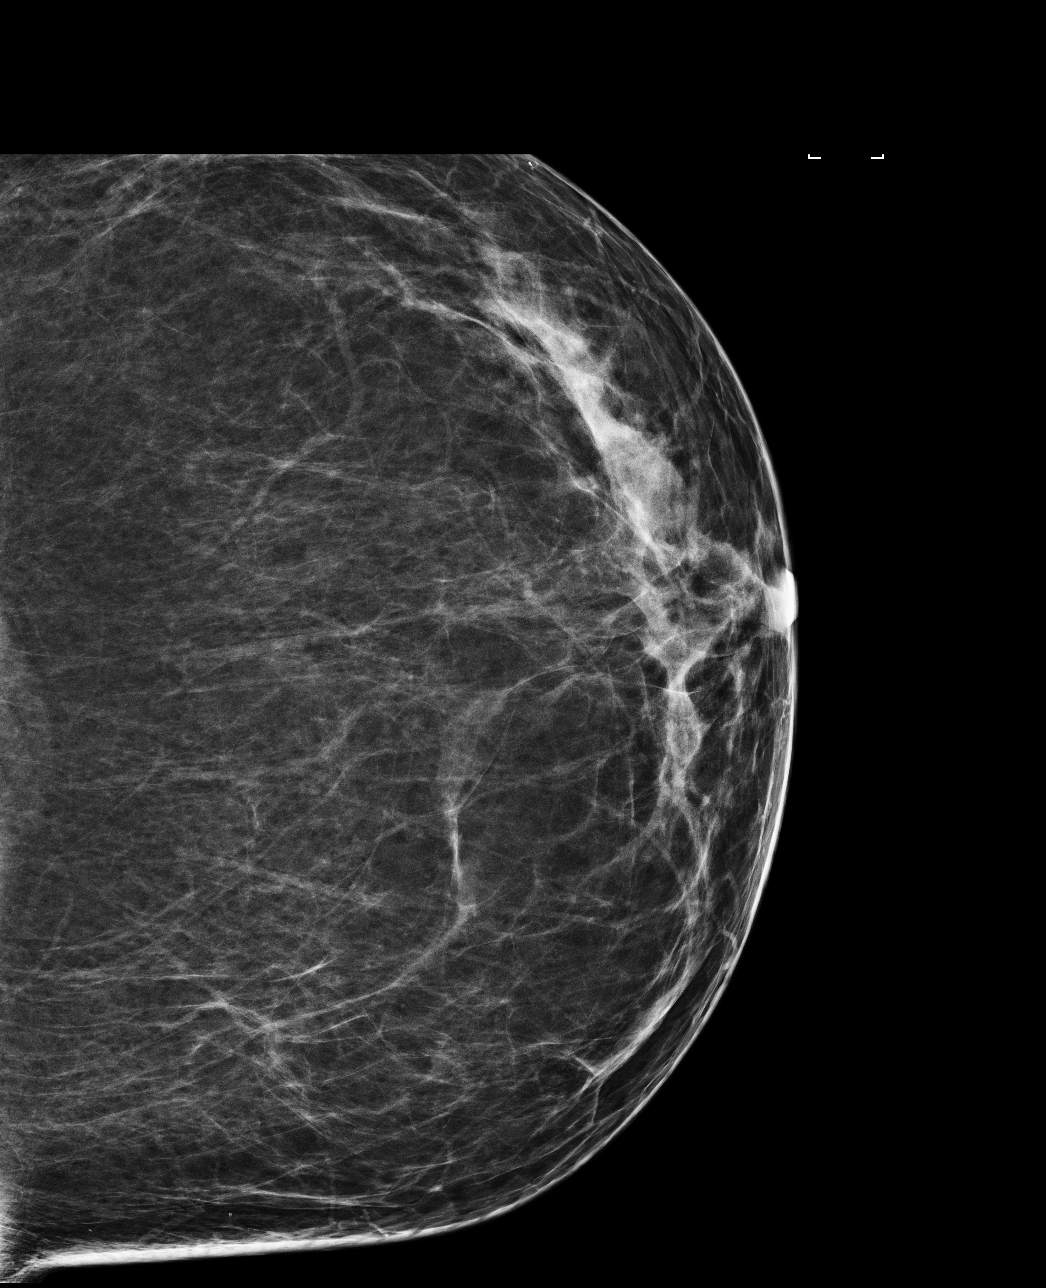

[R CC]
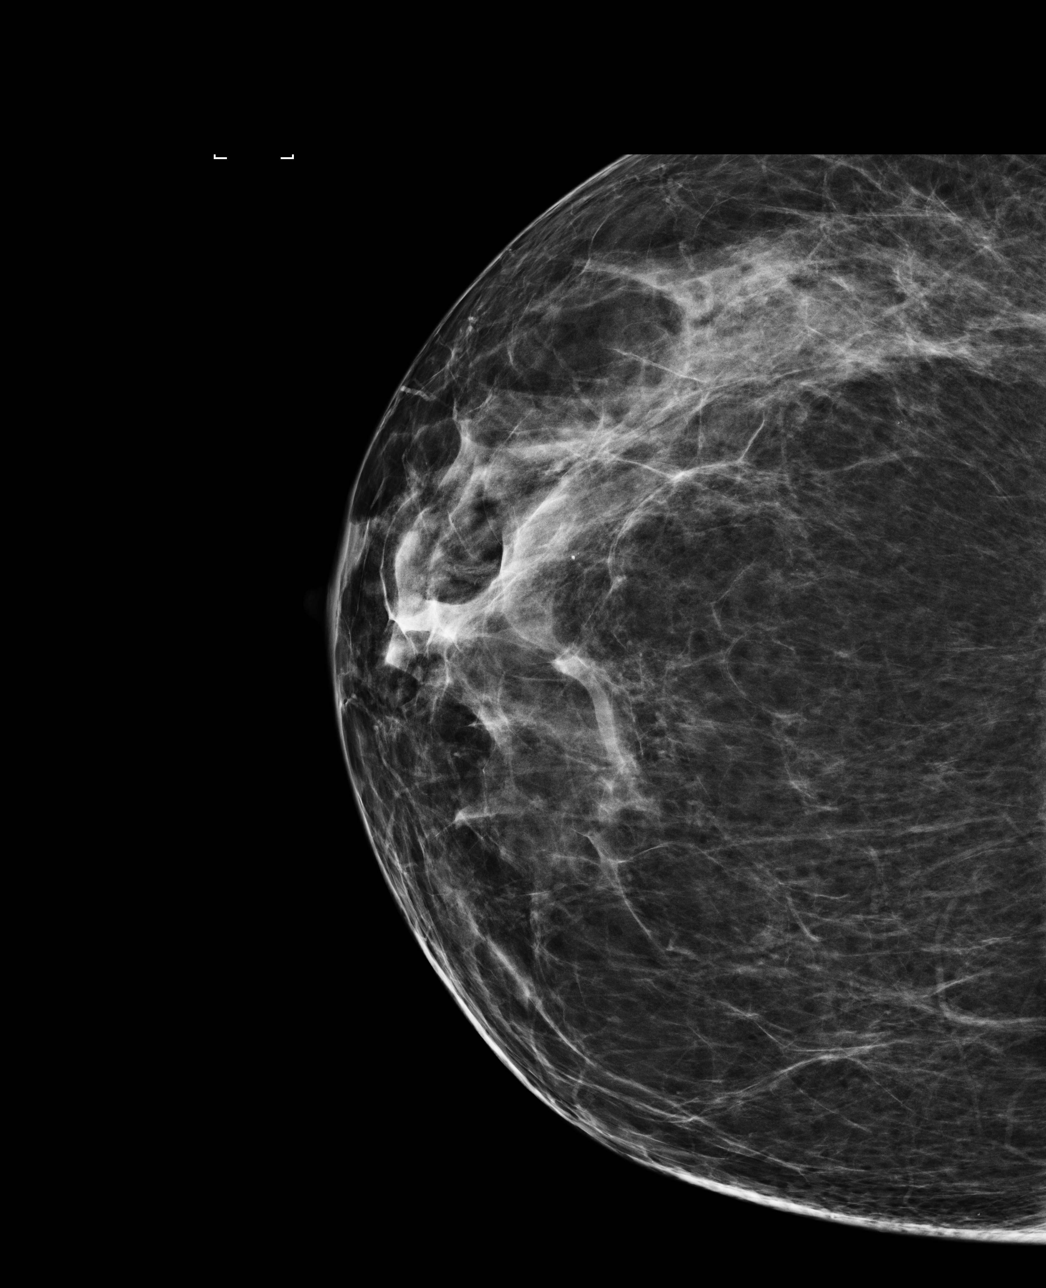

[R MLO]
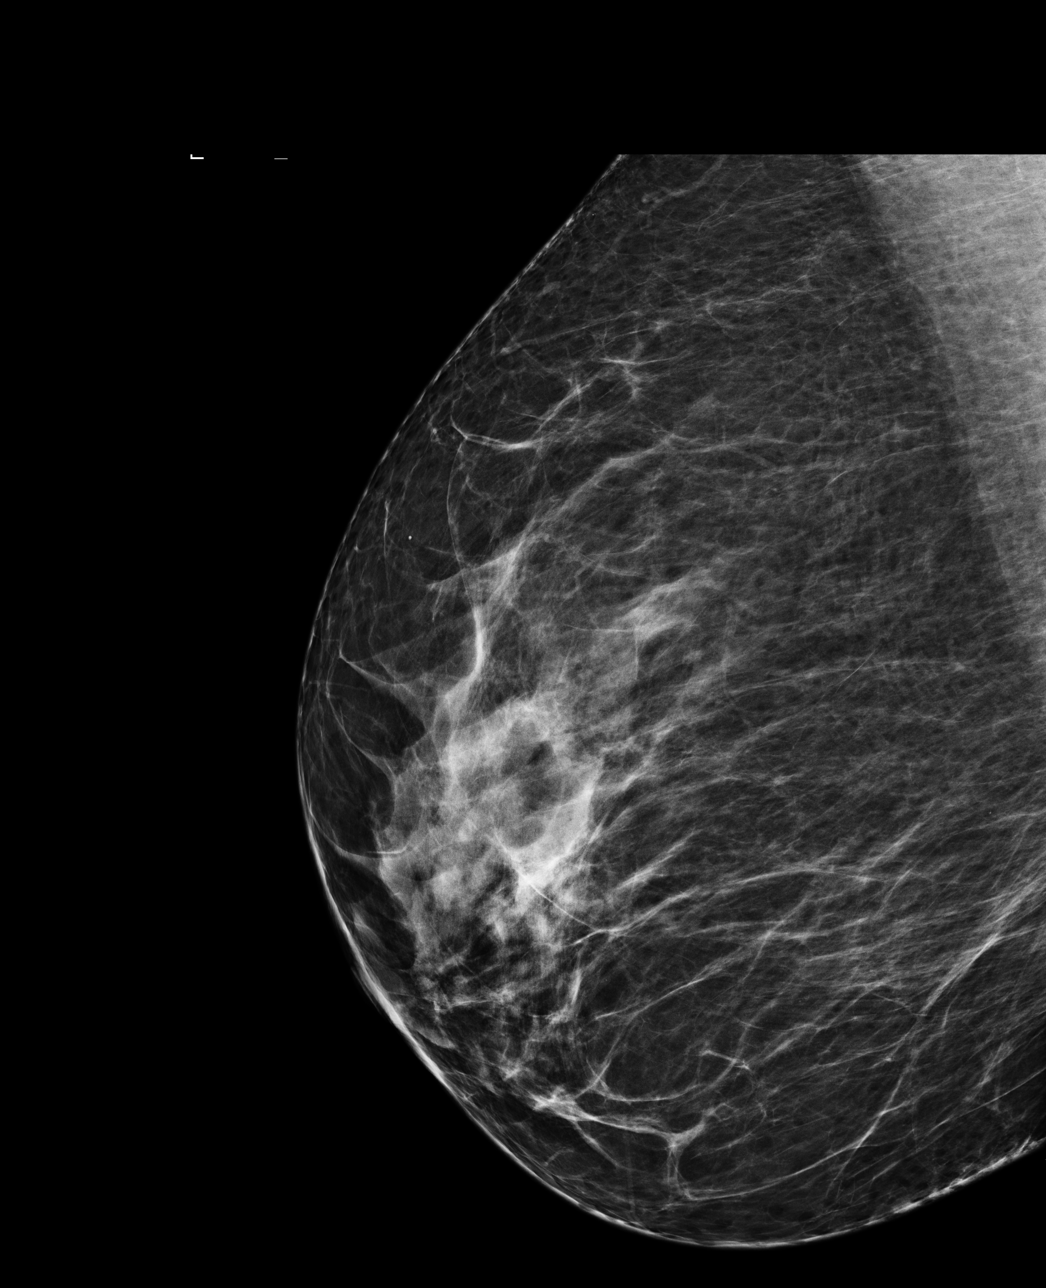

[4 of 4 positions shown; findings below may reference images not displayed]

ACR Breast Density Category b: There are scattered areas of
fibroglandular density.
FINDINGS: There are no findings suspicious for malignancy. Images were
processed with CAD.
IMPRESSION: No mammographic evidence of malignancy. A result letter of this
screening mammogram will be mailed directly to the patient.

RECOMMENDATION:
Screening mammogram in one year. (Code:AS-G-LCT)

BI-RADS CATEGORY  1: Negative.

## 2017-05-28 ENCOUNTER — Encounter: Payer: Self-pay | Admitting: Medical

## 2017-05-28 ENCOUNTER — Ambulatory Visit (INDEPENDENT_AMBULATORY_CARE_PROVIDER_SITE_OTHER): Payer: BLUE CROSS/BLUE SHIELD | Admitting: Medical

## 2017-05-28 ENCOUNTER — Other Ambulatory Visit (HOSPITAL_COMMUNITY)
Admission: RE | Admit: 2017-05-28 | Discharge: 2017-05-28 | Disposition: A | Discharge status: Home / Self Care | Payer: BLUE CROSS/BLUE SHIELD | Source: Ambulatory Visit | Attending: Medical | Admitting: Medical

## 2017-05-28 VITALS — BP 134/84 | HR 70 | Ht 67.5 in | Wt 280.4 lb

## 2017-05-28 DIAGNOSIS — Z124 Encounter for screening for malignant neoplasm of cervix: Secondary | ICD-10-CM | POA: Insufficient documentation

## 2017-05-28 DIAGNOSIS — E041 Nontoxic single thyroid nodule: Secondary | ICD-10-CM | POA: Insufficient documentation

## 2017-05-28 DIAGNOSIS — Z7185 Encounter for immunization safety counseling: Secondary | ICD-10-CM | POA: Insufficient documentation

## 2017-05-28 DIAGNOSIS — I890 Lymphedema, not elsewhere classified: Secondary | ICD-10-CM | POA: Diagnosis not present

## 2017-05-28 DIAGNOSIS — Z7189 Other specified counseling: Secondary | ICD-10-CM | POA: Diagnosis not present

## 2017-05-28 DIAGNOSIS — M67441 Ganglion, right hand: Secondary | ICD-10-CM

## 2017-05-28 DIAGNOSIS — Z1239 Encounter for other screening for malignant neoplasm of breast: Secondary | ICD-10-CM

## 2017-05-28 DIAGNOSIS — Z0000 Encounter for general adult medical examination without abnormal findings: Secondary | ICD-10-CM | POA: Diagnosis not present

## 2017-05-28 DIAGNOSIS — I517 Cardiomegaly: Secondary | ICD-10-CM

## 2017-05-28 DIAGNOSIS — E669 Obesity, unspecified: Secondary | ICD-10-CM

## 2017-05-28 DIAGNOSIS — I10 Essential (primary) hypertension: Secondary | ICD-10-CM | POA: Diagnosis not present

## 2017-05-28 DIAGNOSIS — Z1231 Encounter for screening mammogram for malignant neoplasm of breast: Secondary | ICD-10-CM | POA: Diagnosis not present

## 2017-05-28 LAB — POCT URINALYSIS DIP (PROADVANTAGE DEVICE)
Bilirubin, UA: NEGATIVE
GLUCOSE UA: NEGATIVE mg/dL
Ketones, POC UA: NEGATIVE mg/dL
Leukocytes, UA: NEGATIVE
NITRITE UA: NEGATIVE
RBC UA: NEGATIVE
SPECIFIC GRAVITY, URINE: 1.03
UUROB: NEGATIVE
pH, UA: 6 (ref 5.0–8.0)

## 2017-05-28 LAB — TSH: TSH: 0.63 mIU/L (ref 0.40–4.50)

## 2017-05-28 LAB — T4, FREE: Free T4: 0.9 ng/dL (ref 0.8–1.8)

## 2017-05-28 MED ORDER — LISINOPRIL-HYDROCHLOROTHIAZIDE 20-25 MG PO TABS: 1.0000 | ORAL_TABLET | Freq: Every day | ORAL | 3 refills | Status: DC

## 2017-05-28 NOTE — Addendum Note (Signed)
Addended by: Tyrone Apple on: 05/28/2017 02:02 PM   Modules accepted: Orders

## 2017-05-28 NOTE — Patient Instructions (Signed)
Recommendations:  I recommend you have a shingles vaccine to help prevent shingles or herpes zoster outbreak.   Please call your insurer to inquire about coverage for the Shingrix vaccine given in 2 doses.   Some insurers cover this vaccine after age 59, some cover this after age 3.  If your insurer covers this, then call to schedule appointment to have this vaccine here.  Try to exercise most days per week  Eat a health low fat diet, work on trying to lose 3-5 pounds per month See your eye doctor yearly for routine vision care.  See your dentist yearly for routine dental care including hygiene visits twice yearly.  Please call insurance after 1st of the year.  I would like you to have an updated echocardiogram/ultrasound of the heart  Get your mammogram yearly

## 2017-05-28 NOTE — Progress Notes (Signed)
Subjective:   HPI  Gina Lopez is a 61 y.o. female who presents for physical Chief Complaint  Patient presents with  . Annual Exam    physical, knot on thumb x 4 months    Medical care team includes: Margaret Staggs, Camelia Eng, PA-C here for primary care Dentist Eye doctor orthopedist   Been feeling healthy.  She is fasting today.  Got flu shot at [facility] a month ago.  Only recent c/o is knot of right thumb that popped up 4-5 months ago.    Gyn hx/o - 1 prior pregnancy and live birth.   No bleeding or vaginal concerns  Reviewed their medical, surgical, family, social, medication, and allergy history and updated chart as appropriate.  Past Medical History:  Diagnosis Date  . Allergy    environmental in spring  . Anemia   . Colon polyp 07/2015   tubular adenoma, Dr. Havery Moros  . Heart murmur    benign, 2013 echo  . History of echocardiogram 02/22/12   mild concentric LVH, EF 55%, left atrial mildly dilated, Dr. Debara Pickett  . Hypertension   . Lymphedema of leg    L>R, sees vein clinic, uses compression pump device at home daily; [facility]  . Obesity   . Wears partial dentures    upper only    Past Surgical History:  Procedure Laterality Date  . COLONOSCOPY     age 21yo due to blood in stool, [place]  . COLONOSCOPY  07/2015   tubular adenoma polyp, repeat 5 years, Dr. Havery Moros  . CYST EXCISION  01/14/15   finger  . FINGER SURGERY     reattachment of tip of pinky finger on left hand  . VEIN LIGATION AND STRIPPING     [facility]    Social History   Socioeconomic History  . Marital status: Single    Spouse name: Not on file  . Number of children: Not on file  . Years of education: Not on file  . Highest education level: Not on file  Social Needs  . Financial resource strain: Not on file  . Food insecurity - worry: Not on file  . Food insecurity - inability: Not on file  . Transportation needs - medical: Not on file  . Transportation needs -  non-medical: Not on file  Occupational History  . Not on file  Tobacco Use  . Smoking status: Former Smoker    Years: 1.00  . Smokeless tobacco: Never Used  Substance and Sexual Activity  . Alcohol use: No  . Drug use: No  . Sexual activity: Not Currently  Other Topics Concern  . Not on file  Social History Narrative   Son in school at [employer].   Single, no significant other.   Exercise - yard work, mowing, walking, mowing as a side job.  Works as Public affairs consultant.  Goes to [place]. 05/2017    Family History  Problem Relation Age of Onset  . Arthritis Mother   . Hypertension Mother   . Obesity Mother   . Hip fracture Mother   . Alcohol abuse Father   . Diabetes Brother   . Cancer Sister        breast  . Heart disease Neg Hx   . Stroke Neg Hx   . Colon cancer Neg Hx   . Stomach cancer Neg Hx   . Rectal cancer Neg Hx      Current Outpatient Medications:  .  iron  polysaccharides (NU-IRON) 150 MG capsule, Take 1 capsule (150 mg total) by mouth daily., Disp: 90 capsule, Rfl: 1 .  lisinopril-hydrochlorothiazide (PRINZIDE,ZESTORETIC) 20-25 MG tablet, Take 1 tablet by mouth daily., Disp: 90 tablet, Rfl: 3  Allergies  Allergen Reactions  . Codeine Other (See Comments)    Pt states it makes her feel dizzy and stay in her system forever  . Terbinafine And Related Hives and Rash     Review of Systems Constitutional: -fever, -chills, -sweats, -unexpected weight change, -decreased appetite, -fatigue Allergy: -sneezing, -itching, -congestion Dermatology: -changing moles, --rash, -lumps ENT: -runny nose, -ear pain, -sore throat, -hoarseness, -sinus pain, -teeth pain, - ringing in ears, -hearing loss, -nosebleeds Cardiology: -chest pain, -palpitations, -swelling, -difficulty breathing when lying flat, -waking up short of breath Respiratory: -cough, -shortness of breath, -difficulty breathing with exercise or exertion, -wheezing, -coughing up  blood Gastroenterology: -abdominal pain, -nausea, -vomiting, -diarrhea, -constipation, -blood in stool, -changes in bowel movement, -difficulty swallowing or eating Hematology: -bleeding, -bruising  Musculoskeletal: -joint aches, -muscle aches, -joint swelling, -back pain, -neck pain, -cramping, -changes in gait Ophthalmology: denies vision changes, eye redness, itching, discharge Urology: -burning with urination, -difficulty urinating, -blood in urine, -urinary frequency, -urgency, -incontinence Neurology: -headache, -weakness, -tingling, -numbness, -memory loss, -falls, -dizziness Psychology: -depressed mood, -agitation, -sleep problems Breast/gyn: -breast tenderness, -discharge, -lumps, -vaginal discharge,- irregular periods, -heavy periods    Objective:   BP 134/84   Pulse 70   Ht 5' 7.5" (1.715 m)   Wt 280 lb 6.4 oz (127.2 kg)   SpO2 96%   BMI 43.27 kg/m   Wt Readings from Last 3 Encounters:  05/28/17 280 lb 6.4 oz (127.2 kg)  08/10/16 293 lb 3.2 oz (133 kg)  04/11/16 285 lb (129.3 kg)   BP Readings from Last 3 Encounters:  05/28/17 134/84  08/10/16 140/82  04/11/16 122/80    General appearance: alert, no distress, WD/WN, African American female Skin: scattered macules, left medial lower breast with small 23mm x 16mm slightly raised brown lesion that appears a little darker than surrounding macules, no other worrisome lesions HEENT: normocephalic, conjunctiva/corneas normal, sclerae anicteric, PERRLA, EOMi, nares patent, no discharge or erythema, pharynx normal Oral cavity: MMM, tongue normal, teeth - upper denture, no other lesions Neck: supple, no lymphadenopathy, no thyromegaly, no masses, normal ROM, no bruits, +JVD/prominent right jugular pulsation Chest: non tender, normal shape and expansion Heart: RRR, normal S1, S2,faint 1-2/6  Murmur Lungs: CTA bilaterally, no wheezes, rhonchi, or rales Abdomen: +bs, soft, non tender, non distended, no masses, no hepatomegaly, no  splenomegaly, no bruits Back: non tender, normal ROM, no scoliosis Musculoskeletal: upper extremities non tender, no obvious deformity, normal ROM throughout, lower extremities non tender, no obvious deformity, normal ROM throughout Extremities: no edema, no cyanosis, no clubbing Pulses: 2+ symmetric, upper and lower extremities, normal cap refill Neurological: alert, oriented x 3, CN2-12 intact, strength normal upper extremities and lower extremities, sensation normal throughout, DTRs 2+ throughout, no cerebellar signs, gait normal Psychiatric: normal affect, behavior normal, pleasant  Breast: nontender, no masses or lumps, no skin changes, no nipple discharge or inversion, no axillary lymphadenopathy Gyn: Normal external genitalia without lesions, vagina with normal mucosa, cervix without lesions, no cervical motion tenderness, no abnormal vaginal discharge.  Uterus and adnexa not enlarged, nontender, no masses.  Pap performed.  Exam chaperoned by nurse. Rectal: deferred    Assessment and Plan :    Encounter Diagnoses  Name Primary?  . Encounter for health maintenance examination in adult Yes  .  Essential hypertension   . LVH (left ventricular hypertrophy)   . Lymphedema   . Screening for breast cancer   . Screening for cervical cancer   . Obesity with serious comorbidity, unspecified classification, unspecified obesity type   . Vaccine counseling   . Thyroid nodule   . Digital mucinous cyst of finger of right hand     Physical exam - discussed and counseled on healthy lifestyle, diet, exercise, preventative care, vaccinations, sick and well care, proper use of emergency dept and after hours care, and addressed their concerns.    Health screening: See your eye doctor yearly for routine vision care. See your dentist yearly for routine dental care including hygiene visits twice yearly.  Cancer screening Discussed and advised monthly self breast exams Discussed mammogram, advised  mammogram yearly Discussed pap smear recommendations.   Pap smear done today Discussed colonoscopy screening. Up to date 2017  Vaccinations: Counseled on the following vaccines:  She had recent flu shot at pharmacy I recommend you have a shingles vaccine to help prevent shingles or herpes zoster outbreak.   Please call your insurer to inquire about coverage for the Shingrix vaccine given in 2 doses.   Some insurers cover this vaccine after age 4, some cover this after age 36.  If your insurer covers this, then call to schedule appointment to have this vaccine here.  Separate significant chronic issues discussed: Cyst of finger - offered ortho referral. She will consider.  Recommendations:  I recommend you have a shingles vaccine to help prevent shingles or herpes zoster outbreak.   Please call your insurer to inquire about coverage for the Shingrix vaccine given in 2 doses.   Some insurers cover this vaccine after age 33, some cover this after age 34.  If your insurer covers this, then call to schedule appointment to have this vaccine here.  Try to exercise most days per week  Eat a health low fat diet, work on trying to lose 3-5 pounds per month See your eye doctor yearly for routine vision care.  See your dentist yearly for routine dental care including hygiene visits twice yearly.  Please call insurance after 1st of the year.  I would like you to have an updated echocardiogram/ultrasound of the heart  Get your mammogram yearly   Ailen was seen today for annual exam.  Diagnoses and all orders for this visit:  Encounter for health maintenance examination in adult -     Comprehensive metabolic panel -     CBC with Differential/Platelet -     Lipid panel -     Hemoglobin A1c -     Cytology - PAP -     TSH -     T4, free  Essential hypertension  LVH (left ventricular hypertrophy)  Lymphedema  Screening for breast cancer  Screening for cervical cancer -     Cytology -  PAP  Obesity with serious comorbidity, unspecified classification, unspecified obesity type  Vaccine counseling  Thyroid nodule -     TSH -     T4, free  Digital mucinous cyst of finger of right hand  Other orders -     lisinopril-hydrochlorothiazide (PRINZIDE,ZESTORETIC) 20-25 MG tablet; Take 1 tablet by mouth daily.   Follow-up pending labs, yearly for physical

## 2017-05-29 LAB — COMPREHENSIVE METABOLIC PANEL
AG Ratio: 1.4 (calc) (ref 1.0–2.5)
ALT: 11 U/L (ref 6–29)
AST: 10 U/L (ref 10–35)
Albumin: 4.2 g/dL (ref 3.6–5.1)
Alkaline phosphatase (APISO): 118 U/L (ref 33–130)
BUN: 12 mg/dL (ref 7–25)
CO2: 31 mmol/L (ref 20–32)
Calcium: 9.5 mg/dL (ref 8.6–10.4)
Chloride: 104 mmol/L (ref 98–110)
Creat: 0.72 mg/dL (ref 0.50–1.05)
GLUCOSE: 84 mg/dL (ref 65–99)
Globulin: 3.1 g/dL (calc) (ref 1.9–3.7)
Potassium: 3.7 mmol/L (ref 3.5–5.3)
SODIUM: 141 mmol/L (ref 135–146)
Total Bilirubin: 0.7 mg/dL (ref 0.2–1.2)
Total Protein: 7.3 g/dL (ref 6.1–8.1)

## 2017-05-29 LAB — CBC WITH DIFFERENTIAL/PLATELET
BASOS ABS: 53 {cells}/uL (ref 0–200)
Basophils Relative: 0.8 %
EOS ABS: 119 {cells}/uL (ref 15–500)
EOS PCT: 1.8 %
HEMATOCRIT: 36.7 % (ref 35.0–45.0)
HEMOGLOBIN: 11.3 g/dL — AB (ref 11.7–15.5)
LYMPHS ABS: 2356 {cells}/uL (ref 850–3900)
MCH: 24.6 pg — AB (ref 27.0–33.0)
MCHC: 30.8 g/dL — AB (ref 32.0–36.0)
MCV: 79.8 fL — ABNORMAL LOW (ref 80.0–100.0)
MPV: 12.8 fL — ABNORMAL HIGH (ref 7.5–12.5)
Monocytes Relative: 5 %
NEUTROS ABS: 3742 {cells}/uL (ref 1500–7800)
Neutrophils Relative %: 56.7 %
Platelets: 228 10*3/uL (ref 140–400)
RBC: 4.6 10*6/uL (ref 3.80–5.10)
RDW: 14.2 % (ref 11.0–15.0)
Total Lymphocyte: 35.7 %
WBC mixed population: 330 cells/uL (ref 200–950)
WBC: 6.6 10*3/uL (ref 3.8–10.8)

## 2017-05-29 LAB — LIPID PANEL
CHOL/HDL RATIO: 2.3 (calc) (ref <5.0)
Cholesterol: 151 mg/dL (ref <200)
HDL: 66 mg/dL (ref >50)
LDL CHOLESTEROL (CALC): 71 mg/dL
NON-HDL CHOLESTEROL (CALC): 85 mg/dL (ref <130)
Triglycerides: 60 mg/dL (ref <150)

## 2017-05-29 LAB — HEMOGLOBIN A1C
Hgb A1c MFr Bld: 6.1 % of total Hgb — ABNORMAL HIGH (ref <5.7)
Mean Plasma Glucose: 128 (calc)
eAG (mmol/L): 7.1 (calc)

## 2017-05-30 ENCOUNTER — Other Ambulatory Visit: Payer: Self-pay | Admitting: Medical

## 2017-05-30 LAB — CYTOLOGY - PAP: Diagnosis: NEGATIVE

## 2017-05-30 MED ORDER — POLYSACCHARIDE IRON COMPLEX 150 MG PO CAPS: 150.0000 mg | ORAL_CAPSULE | Freq: Every day | ORAL | 3 refills | Status: DC

## 2019-04-22 ENCOUNTER — Other Ambulatory Visit: Payer: Self-pay | Admitting: Medical

## 2019-04-22 DIAGNOSIS — Z1231 Encounter for screening mammogram for malignant neoplasm of breast: Secondary | ICD-10-CM

## 2019-06-16 ENCOUNTER — Ambulatory Visit
Admission: RE | Admit: 2019-06-16 | Discharge: 2019-06-16 | Disposition: A | Discharge status: Home / Self Care | Payer: 59 | Source: Ambulatory Visit | Attending: Medical | Admitting: Medical

## 2019-06-16 ENCOUNTER — Other Ambulatory Visit: Payer: Self-pay

## 2019-06-16 DIAGNOSIS — Z1231 Encounter for screening mammogram for malignant neoplasm of breast: Secondary | ICD-10-CM

## 2019-06-16 NOTE — Progress Notes (Signed)
Unable to reach pt

## 2019-06-26 ENCOUNTER — Telehealth: Payer: Self-pay

## 2019-06-26 NOTE — Telephone Encounter (Signed)
Pt. Has been scheduled for her CPE per your note on her mammogram results.

## 2019-07-25 ENCOUNTER — Encounter (HOSPITAL_COMMUNITY): Payer: Self-pay | Admitting: Urgent Care

## 2019-07-25 ENCOUNTER — Ambulatory Visit (HOSPITAL_COMMUNITY)
Admission: EM | Admit: 2019-07-25 | Discharge: 2019-07-25 | Disposition: A | Discharge status: Home / Self Care | Payer: 59 | Attending: Urgent Care | Admitting: Urgent Care

## 2019-07-25 ENCOUNTER — Ambulatory Visit (INDEPENDENT_AMBULATORY_CARE_PROVIDER_SITE_OTHER): Payer: 59

## 2019-07-25 ENCOUNTER — Other Ambulatory Visit: Payer: Self-pay

## 2019-07-25 DIAGNOSIS — M1712 Unilateral primary osteoarthritis, left knee: Secondary | ICD-10-CM

## 2019-07-25 DIAGNOSIS — M25462 Effusion, left knee: Secondary | ICD-10-CM

## 2019-07-25 DIAGNOSIS — M25562 Pain in left knee: Secondary | ICD-10-CM

## 2019-07-25 DIAGNOSIS — R030 Elevated blood-pressure reading, without diagnosis of hypertension: Secondary | ICD-10-CM

## 2019-07-25 DIAGNOSIS — I10 Essential (primary) hypertension: Secondary | ICD-10-CM

## 2019-07-25 MED ORDER — PREDNISONE 20 MG PO TABS: ORAL_TABLET | ORAL | 0 refills | Status: DC

## 2019-07-25 NOTE — ED Triage Notes (Signed)
Pt state she has had left knee pain x 1 week or more. Pt state she's not sure what's happening her knee.

## 2019-07-25 NOTE — ED Provider Notes (Signed)
[place]   MRN: [id number] DOB: 1956-06-06  Subjective:   Gina Lopez is a 64 y.o. female presenting for 1 week history of recurrent left knee pain and swelling.  Has tried Voltaren gel without any relief.  Patient has an orthopedist, has seen them before for her left knee and had a steroid injection done.  Has not had consistent follow-up with them.  Last x-ray in 2017 showed osteoarthritis and an effusion.  Has not had imaging since then.  Denies trauma, fall, knee buckling.  No current facility-administered medications for this encounter.  Current Outpatient Medications:  .  iron polysaccharides (NU-IRON) 150 MG capsule, Take 1 capsule (150 mg total) by mouth daily., Disp: 90 capsule, Rfl: 3 .  lisinopril-hydrochlorothiazide (PRINZIDE,ZESTORETIC) 20-25 MG tablet, Take 1 tablet by mouth daily., Disp: 90 tablet, Rfl: 3   Allergies  Allergen Reactions  . Codeine Other (See Comments)    Pt states it makes her feel dizzy and stay in her system forever  . Terbinafine And Related Hives and Rash    Past Medical History:  Diagnosis Date  . Allergy    environmental in spring  . Anemia   . Colon polyp 07/2015   tubular adenoma, Dr. Havery Moros  . Heart murmur    benign, 2013 echo  . History of echocardiogram 02/22/12   mild concentric LVH, EF 55%, left atrial mildly dilated, Dr. Debara Pickett  . Hypertension   . Lymphedema of leg    L>R, sees vein clinic, uses compression pump device at home daily; [facility]  . Obesity   . Wears partial dentures    upper only     Past Surgical History:  Procedure Laterality Date  . COLONOSCOPY     age 76yo due to blood in stool, [place]  . COLONOSCOPY  07/2015   tubular adenoma polyp, repeat 5 years, Dr. Havery Moros  . CYST EXCISION  01/14/15   finger  . FINGER SURGERY     reattachment of tip of pinky finger on left hand  . VEIN LIGATION AND STRIPPING     [facility]    Family History  Problem Relation Age of Onset    . Arthritis Mother   . Hypertension Mother   . Obesity Mother   . Hip fracture Mother   . Alcohol abuse Father   . Diabetes Brother   . Cancer Sister        breast  . Heart disease Neg Hx   . Stroke Neg Hx   . Colon cancer Neg Hx   . Stomach cancer Neg Hx   . Rectal cancer Neg Hx     Social History   Tobacco Use  . Smoking status: Former Smoker    Years: 1.00  . Smokeless tobacco: Never Used  Substance Use Topics  . Alcohol use: No  . Drug use: No    ROS   Objective:   Vitals: BP (!) 172/67 (BP Location: Right Arm)   Pulse 76   Temp 98.7 F (37.1 C) (Oral)   Resp 18   Wt (!) 380 lb (172.4 kg)   SpO2 100%   BMI 58.64 kg/m   Physical Exam Constitutional:      General: She is not in acute distress.    Appearance: Normal appearance. She is well-developed. She is obese. She is not ill-appearing, toxic-appearing or diaphoretic.  HENT:     Head: Normocephalic and atraumatic.     Nose: Nose normal.  Mouth/Throat:     Mouth: Mucous membranes are moist.     Pharynx: Oropharynx is clear.  Eyes:     General: No scleral icterus.    Extraocular Movements: Extraocular movements intact.     Pupils: Pupils are equal, round, and reactive to light.  Cardiovascular:     Rate and Rhythm: Normal rate and regular rhythm.     Pulses: Normal pulses.     Heart sounds: Normal heart sounds. No murmur. No friction rub. No gallop.   Pulmonary:     Effort: Pulmonary effort is normal. No respiratory distress.     Breath sounds: Normal breath sounds. No stridor. No wheezing, rhonchi or rales.  Musculoskeletal:     Left knee: Swelling (1+) and effusion present. No deformity, erythema, ecchymosis, bony tenderness or crepitus. Decreased range of motion. Tenderness present over the medial joint line. No lateral joint line or patellar tendon tenderness. Normal alignment, normal meniscus and normal patellar mobility.  Skin:    General: Skin is warm and dry.     Findings: No rash.   Neurological:     General: No focal deficit present.     Mental Status: She is alert and oriented to person, place, and time.     Cranial Nerves: No cranial nerve deficit.     Motor: No weakness.     Coordination: Coordination normal.     Gait: Gait normal.     Deep Tendon Reflexes: Reflexes normal.  Psychiatric:        Mood and Affect: Mood normal.        Behavior: Behavior normal.        Thought Content: Thought content normal.        Judgment: Judgment normal.     DG Knee Complete 4 Views Left  Result Date: 07/25/2019 CLINICAL DATA:  Left knee pain. No injury. EXAM: LEFT KNEE - COMPLETE 4+ VIEW COMPARISON:  Oct 22, 2015 FINDINGS: There is no acute fracture or dislocation. Degenerative joint changes with osteophyte formation narrow femoral tibial joint space are noted. IMPRESSION: Degenerative joint changes of left knee. Electronically Signed   By: Abelardo Diesel M.D.   On: 07/25/2019 13:30    Assessment and Plan :   1. Osteoarthritis of left knee, unspecified osteoarthritis type   2. Acute pain of left knee   3. Pain and swelling of left knee   4. Essential hypertension   5. Elevated blood pressure reading     Start prednisone for OA flare. Recommended patient start prednisone course. Follow up with ortho, info provided. Follow up with PCP on HTN. No PE findings today warranting ER visit or changes to her medications. BP elevated today likely because of her pain. Counseled patient on potential for adverse effects with medications prescribed/recommended today, ER and return-to-clinic precautions discussed, patient verbalized understanding.    Jaynee Eagles, PA-C 07/25/19 1347

## 2019-08-07 ENCOUNTER — Other Ambulatory Visit: Payer: Self-pay

## 2019-08-07 ENCOUNTER — Encounter: Payer: Self-pay | Admitting: Medical

## 2019-08-07 ENCOUNTER — Ambulatory Visit: Payer: 59 | Admitting: Medical

## 2019-08-07 VITALS — BP 190/92 | HR 74 | Temp 96.9°F | Wt 291.6 lb

## 2019-08-07 DIAGNOSIS — Z0000 Encounter for general adult medical examination without abnormal findings: Secondary | ICD-10-CM | POA: Diagnosis not present

## 2019-08-07 DIAGNOSIS — Z13220 Encounter for screening for lipoid disorders: Secondary | ICD-10-CM

## 2019-08-07 DIAGNOSIS — Z131 Encounter for screening for diabetes mellitus: Secondary | ICD-10-CM

## 2019-08-07 DIAGNOSIS — E041 Nontoxic single thyroid nodule: Secondary | ICD-10-CM

## 2019-08-07 DIAGNOSIS — R011 Cardiac murmur, unspecified: Secondary | ICD-10-CM

## 2019-08-07 DIAGNOSIS — I10 Essential (primary) hypertension: Secondary | ICD-10-CM

## 2019-08-07 DIAGNOSIS — Z7189 Other specified counseling: Secondary | ICD-10-CM

## 2019-08-07 DIAGNOSIS — E669 Obesity, unspecified: Secondary | ICD-10-CM

## 2019-08-07 DIAGNOSIS — I517 Cardiomegaly: Secondary | ICD-10-CM

## 2019-08-07 DIAGNOSIS — Z7185 Encounter for immunization safety counseling: Secondary | ICD-10-CM

## 2019-08-07 DIAGNOSIS — D509 Iron deficiency anemia, unspecified: Secondary | ICD-10-CM

## 2019-08-07 DIAGNOSIS — I890 Lymphedema, not elsewhere classified: Secondary | ICD-10-CM

## 2019-08-07 LAB — POCT URINALYSIS DIP (PROADVANTAGE DEVICE)
Bilirubin, UA: NEGATIVE
Blood, UA: NEGATIVE
Glucose, UA: NEGATIVE mg/dL
Ketones, POC UA: NEGATIVE mg/dL
Leukocytes, UA: NEGATIVE
Nitrite, UA: NEGATIVE
Specific Gravity, Urine: 1.02
Urobilinogen, Ur: 0.2
pH, UA: 6.5 (ref 5.0–8.0)

## 2019-08-07 MED ORDER — VALSARTAN-HYDROCHLOROTHIAZIDE 160-12.5 MG PO TABS: 1.0000 | ORAL_TABLET | Freq: Every day | ORAL | 3 refills | Status: DC

## 2019-08-07 MED ORDER — PREDNISONE 20 MG PO TABS: ORAL_TABLET | ORAL | 0 refills | Status: DC
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
## 2019-08-08 LAB — TSH: TSH: 1.2 u[IU]/mL (ref 0.450–4.500)

## 2019-08-08 LAB — CBC WITH DIFFERENTIAL/PLATELET
Basophils Absolute: 0.1 10*3/uL (ref 0.0–0.2)
Basos: 1 %
EOS (ABSOLUTE): 0.2 10*3/uL (ref 0.0–0.4)
Eos: 2 %
Hematocrit: 33.3 % — ABNORMAL LOW (ref 34.0–46.6)
Hemoglobin: 10 g/dL — ABNORMAL LOW (ref 11.1–15.9)
Immature Grans (Abs): 0 10*3/uL (ref 0.0–0.1)
Immature Granulocytes: 0 %
Lymphocytes Absolute: 2.7 10*3/uL (ref 0.7–3.1)
Lymphs: 27 %
MCH: 24.2 pg — ABNORMAL LOW (ref 26.6–33.0)
MCHC: 30 g/dL — ABNORMAL LOW (ref 31.5–35.7)
MCV: 80 fL (ref 79–97)
Monocytes Absolute: 0.7 10*3/uL (ref 0.1–0.9)
Monocytes: 7 %
Neutrophils Absolute: 6.2 10*3/uL (ref 1.4–7.0)
Neutrophils: 63 %
Platelets: 209 10*3/uL (ref 150–450)
RBC: 4.14 x10E6/uL (ref 3.77–5.28)
RDW: 14.5 % (ref 11.7–15.4)
WBC: 9.9 10*3/uL (ref 3.4–10.8)

## 2019-08-08 LAB — COMPREHENSIVE METABOLIC PANEL
ALT: 13 IU/L (ref 0–32)
AST: 9 IU/L (ref 0–40)
Albumin/Globulin Ratio: 1.6 (ref 1.2–2.2)
Albumin: 4 g/dL (ref 3.8–4.8)
Alkaline Phosphatase: 139 IU/L — ABNORMAL HIGH (ref 39–117)
BUN/Creatinine Ratio: 12 (ref 12–28)
BUN: 11 mg/dL (ref 8–27)
Bilirubin Total: 0.3 mg/dL (ref 0.0–1.2)
CO2: 23 mmol/L (ref 20–29)
Calcium: 9.4 mg/dL (ref 8.7–10.3)
Chloride: 108 mmol/L — ABNORMAL HIGH (ref 96–106)
Creatinine, Ser: 0.93 mg/dL (ref 0.57–1.00)
GFR calc Af Amer: 77 mL/min/{1.73_m2} (ref >59)
GFR calc non Af Amer: 67 mL/min/{1.73_m2} (ref >59)
Globulin, Total: 2.5 g/dL (ref 1.5–4.5)
Glucose: 73 mg/dL (ref 65–99)
Potassium: 4.2 mmol/L (ref 3.5–5.2)
Sodium: 144 mmol/L (ref 134–144)
Total Protein: 6.5 g/dL (ref 6.0–8.5)

## 2019-08-08 LAB — LIPID PANEL
Chol/HDL Ratio: 2.3 ratio (ref 0.0–4.4)
Cholesterol, Total: 141 mg/dL (ref 100–199)
HDL: 62 mg/dL (ref >39)
LDL Chol Calc (NIH): 66 mg/dL (ref 0–99)
Triglycerides: 65 mg/dL (ref 0–149)
VLDL Cholesterol Cal: 13 mg/dL (ref 5–40)

## 2019-08-11 ENCOUNTER — Other Ambulatory Visit: Payer: Self-pay | Admitting: Medical

## 2019-08-11 MED ORDER — VITAMIN D 25 MCG (1000 UNIT) PO TABS: 1000.0000 [IU] | ORAL_TABLET | Freq: Every day | ORAL | 3 refills | Status: DC

## 2019-08-11 MED ORDER — POLYSACCHARIDE IRON COMPLEX 150 MG PO CAPS: 150.0000 mg | ORAL_CAPSULE | Freq: Every day | ORAL | 3 refills | Status: DC

## 2019-09-04 ENCOUNTER — Encounter: Payer: Self-pay | Admitting: Medical

## 2019-09-04 ENCOUNTER — Ambulatory Visit: Payer: 59 | Admitting: Medical

## 2019-09-04 ENCOUNTER — Other Ambulatory Visit: Payer: Self-pay

## 2019-09-04 VITALS — BP 146/78 | HR 82 | Temp 98.1°F | Ht 68.0 in | Wt 290.8 lb

## 2019-09-04 DIAGNOSIS — R748 Abnormal levels of other serum enzymes: Secondary | ICD-10-CM | POA: Insufficient documentation

## 2019-09-04 DIAGNOSIS — I517 Cardiomegaly: Secondary | ICD-10-CM | POA: Diagnosis not present

## 2019-09-04 DIAGNOSIS — E041 Nontoxic single thyroid nodule: Secondary | ICD-10-CM

## 2019-09-04 DIAGNOSIS — E669 Obesity, unspecified: Secondary | ICD-10-CM

## 2019-09-04 DIAGNOSIS — I10 Essential (primary) hypertension: Secondary | ICD-10-CM

## 2019-09-04 DIAGNOSIS — D509 Iron deficiency anemia, unspecified: Secondary | ICD-10-CM

## 2019-09-04 DIAGNOSIS — J301 Allergic rhinitis due to pollen: Secondary | ICD-10-CM

## 2019-09-04 DIAGNOSIS — R252 Cramp and spasm: Secondary | ICD-10-CM

## 2019-09-04 NOTE — Progress Notes (Signed)
Subjective: Chief Complaint  Patient presents with  . Follow-up    elevated blood pressure    Here for recheck on BP.  I saw her a month ago for a physical.  No home readings.    She is compliant back on Valsartan HCT 160/12.5mg  daily.  She had been noncompliant prior to a month ago at her physical.   No chest pain, no dyspnea.   Fatigue in general, works a lot, too tired to exercise but knows she needs too.  She is having a lot of leg cramping from starting back on blood pressure pill.  She is here to discuss other abnormal labs from last visit including elevated alkaline phosphatase, anemia, thyroid nodule prior.  She also has some allergy symptoms, runny nose, nasal congestion just ordered with the pollen.  Not using anything for this.  She is compliant with iron.  Is getting constipated   Past Medical History:  Diagnosis Date  . Allergy    environmental in spring  . Anemia   . Colon polyp 07/2015   tubular adenoma, Dr. Havery Moros  . Heart murmur    benign, 2013 echo  . History of echocardiogram 02/22/12   mild concentric LVH, EF 55%, left atrial mildly dilated, Dr. Debara Pickett  . Hypertension   . Lymphedema of leg    L>R, sees vein clinic, uses compression pump device at home daily; Vein Clinic  . Obesity   . Wears partial dentures    upper only   Current Outpatient Medications on File Prior to Visit  Medication Sig Dispense Refill  . cholecalciferol (VITAMIN D3) 25 MCG (1000 UNIT) tablet Take 1 tablet (1,000 Units total) by mouth daily. 90 tablet 3  . iron polysaccharides (NU-IRON) 150 MG capsule Take 1 capsule (150 mg total) by mouth daily. 90 capsule 3  . valsartan-hydrochlorothiazide (DIOVAN HCT) 160-12.5 MG tablet Take 1 tablet by mouth daily. 90 tablet 3  . Multiple Vitamins-Minerals (HAIR SKIN AND NAILS FORMULA PO) Take by mouth.     No current facility-administered medications on file prior to visit.   Past Surgical History:  Procedure Laterality Date  . COLONOSCOPY      age 64yo due to blood in stool, [place]  . COLONOSCOPY  07/2015   tubular adenoma polyp, repeat 5 years, Dr. Havery Moros  . CYST EXCISION  01/14/15   finger  . FINGER SURGERY     reattachment of tip of pinky finger on left hand  . VEIN LIGATION AND STRIPPING     [facility]   ROS as in subjective    Objective: BP (!) 146/78   Pulse 82   Temp 98.1 F (36.7 C)   Ht 5\' 8"  (1.727 m)   Wt 290 lb 12.8 oz (131.9 kg)   SpO2 98%   BMI 44.22 kg/m   Wt Readings from Last 3 Encounters:  09/04/19 290 lb 12.8 oz (131.9 kg)  08/07/19 291 lb 9.6 oz (132.3 kg)  07/25/19 (!) 380 lb (172.4 kg)   BP Readings from Last 3 Encounters:  09/04/19 (!) 146/78  08/07/19 (!) 190/92  07/25/19 (!) 172/67   General appearence: alert, no distress, WD/WN,  HEENT: normocephalic, sclerae anicteric, nares with clear discharge and turbinate edema, pharynx normal Oral cavity: MMM, no lesions Neck: supple, no lymphadenopathy, no thyromegaly, no masses Heart: RRR, normal S1, S2, no murmurs Lungs: CTA bilaterally, no wheezes, rhonchi, or rales Legs nontender Pulses: 2+ symmetric, upper and lower extremities, normal cap refill  Assessment: Encounter Diagnoses  Name Primary?  . Essential hypertension Yes  . Thyroid nodule   . LVH (left ventricular hypertrophy)   . Obesity with serious comorbidity, unspecified classification, unspecified obesity type   . Iron deficiency anemia, unspecified iron deficiency anemia type   . Alkaline phosphatase elevation   . Allergic rhinitis due to pollen, unspecified seasonality   . Leg cramp     Plan:  Blood pressure is improved but not at goal.  However she is also having cramping.  She is also getting constipation with the iron therapy  We discussed her findings and abnormalities on last visit  We reviewed the recommendations below    Recommendations  High blood pressure  Your blood pressure is improving, but I do not like the fact you  are getting cramps  We will check labs today.  Pending labs I will either modify your current medication dose or we might add a different medicine to your regimen  I may also end up adding potassium as some blood pressure medicines can lower the potassium level which can lead to cramps  Work on weight loss through healthy diet and exercise  Limit salt intake  Cut down on meat intake in general as this is contributing to high blood pressure   Anemia  Return stool cards for blood screening  Continue iron  If you are getting constipated, take the iron with orange juice or food  If needed you could try cutting back iron supplement to every other day  We will call back regarding iron level with the blood test today  You can also take stool softener as needed   Alkaline phosphatase elevation  There are several things that can cause this lab to be elevated  I suspect vitamin D deficiency  We will recheck an alkaline phosphatase level today to make sure is not significantly worse   Thyroid nodule  It is recommended to repeat ultrasound for this  Please check your insurance about deductible and co-pay  You can call [facility] to inquire about co-pay and cash upfront    Please call [place]  Yesica was seen today for follow-up.  Diagnoses and all orders for this visit:  Essential hypertension -     Comprehensive metabolic panel  Thyroid nodule  LVH (left ventricular hypertrophy)  Obesity with serious comorbidity, unspecified classification, unspecified obesity type  Iron deficiency anemia, unspecified iron deficiency anemia type -     Iron  Alkaline phosphatase elevation -     Comprehensive metabolic panel  Allergic rhinitis due to pollen, unspecified seasonality  Leg cramp -     Comprehensive metabolic panel -     Magnesium  f/u pending labs

## 2019-09-04 NOTE — Patient Instructions (Signed)
Encounter Diagnoses  Name Primary?  . Essential hypertension Yes  . Thyroid nodule   . LVH (left ventricular hypertrophy)   . Obesity with serious comorbidity, unspecified classification, unspecified obesity type   . Iron deficiency anemia, unspecified iron deficiency anemia type   . Alkaline phosphatase elevation   . Allergic rhinitis due to pollen, unspecified seasonality   . Leg cramp     Recommendations  High blood pressure  Your blood pressure is improving, but I do not like the fact you are getting cramps  We will check labs today.  Pending labs I will either modify your current medication dose or we might add a different medicine to your regimen  I may also end up adding potassium as some blood pressure medicines can lower the potassium level which can lead to cramps  Work on weight loss through healthy diet and exercise  Limit salt intake  Cut down on meat intake in general as this is contributing to high blood pressure   Anemia  Return stool cards for blood screening  Continue iron  If you are getting constipated, take the iron with orange juice or food  If needed you could try cutting back iron supplement to every other day  We will call back regarding iron level with the blood test today  You can also take stool softener as needed   Alkaline phosphatase elevation  There are several things that can cause this lab to be elevated  I suspect vitamin D deficiency  We will recheck an alkaline phosphatase level today to make sure is not significantly worse   Thyroid nodule  It is recommended to repeat ultrasound for this  Please check your insurance about deductible and co-pay  You can call [facility] to inquire about co-pay and cash upfront    Please go to [place] [phone]

## 2019-09-05 ENCOUNTER — Other Ambulatory Visit: Payer: Self-pay | Admitting: Medical

## 2019-09-05 LAB — COMPREHENSIVE METABOLIC PANEL
ALT: 12 IU/L (ref 0–32)
AST: 14 IU/L (ref 0–40)
Albumin/Globulin Ratio: 1.3 (ref 1.2–2.2)
Albumin: 4 g/dL (ref 3.8–4.8)
Alkaline Phosphatase: 138 IU/L — ABNORMAL HIGH (ref 39–117)
BUN/Creatinine Ratio: 18 (ref 12–28)
BUN: 14 mg/dL (ref 8–27)
Bilirubin Total: 0.3 mg/dL (ref 0.0–1.2)
CO2: 26 mmol/L (ref 20–29)
Calcium: 9.3 mg/dL (ref 8.7–10.3)
Chloride: 103 mmol/L (ref 96–106)
Creatinine, Ser: 0.76 mg/dL (ref 0.57–1.00)
GFR calc Af Amer: 98 mL/min/{1.73_m2} (ref >59)
GFR calc non Af Amer: 85 mL/min/{1.73_m2} (ref >59)
Globulin, Total: 3.2 g/dL (ref 1.5–4.5)
Glucose: 86 mg/dL (ref 65–99)
Potassium: 3.7 mmol/L (ref 3.5–5.2)
Sodium: 141 mmol/L (ref 134–144)
Total Protein: 7.2 g/dL (ref 6.0–8.5)

## 2019-09-05 LAB — IRON: Iron: 25 ug/dL — ABNORMAL LOW (ref 27–139)

## 2019-09-05 LAB — MAGNESIUM: Magnesium: 1.9 mg/dL (ref 1.6–2.3)

## 2019-09-05 MED ORDER — VALSARTAN 320 MG PO TABS: 320.0000 mg | ORAL_TABLET | Freq: Every day | ORAL | 2 refills | Status: DC

## 2019-09-08 ENCOUNTER — Telehealth: Payer: Self-pay

## 2019-09-08 NOTE — Telephone Encounter (Signed)
If she is concerned she can go ahead and get the Covid test and then get the shot if it is negative

## 2019-09-08 NOTE — Telephone Encounter (Signed)
Patient called and wants to know if she can take her COVID vaccine on this upcoming Thursday is she is experiencing symptoms of nasal congestion and a small cough for 2 weeks? Please advise.

## 2019-09-08 NOTE — Telephone Encounter (Signed)
Lmom informing patient of provider message. Patient was asked to call the office if she has any additional questions.

## 2019-09-09 ENCOUNTER — Other Ambulatory Visit (INDEPENDENT_AMBULATORY_CARE_PROVIDER_SITE_OTHER): Payer: 59

## 2019-09-09 DIAGNOSIS — Z1211 Encounter for screening for malignant neoplasm of colon: Secondary | ICD-10-CM | POA: Diagnosis not present

## 2019-09-09 LAB — HEMOCCULT GUIAC POC 1CARD (OFFICE)
Card #2 Fecal Occult Blod, POC: NEGATIVE
Card #3 Fecal Occult Blood, POC: NEGATIVE
Fecal Occult Blood, POC: NEGATIVE

## 2019-10-30 ENCOUNTER — Other Ambulatory Visit: Payer: Self-pay | Admitting: Medical

## 2019-10-30 ENCOUNTER — Telehealth: Payer: Self-pay | Admitting: Medical

## 2019-10-30 DIAGNOSIS — E010 Iodine-deficiency related diffuse (endemic) goiter: Secondary | ICD-10-CM

## 2019-10-30 DIAGNOSIS — E041 Nontoxic single thyroid nodule: Secondary | ICD-10-CM

## 2019-10-30 NOTE — Telephone Encounter (Signed)
Pt called and stated she needs a referral for [facility] to have her thyroid checked

## 2019-10-30 NOTE — Telephone Encounter (Signed)
Please schedule thyroid ultrasound

## 2019-11-02 NOTE — Telephone Encounter (Signed)
This has been scheduled

## 2019-11-04 ENCOUNTER — Ambulatory Visit
Admission: RE | Admit: 2019-11-04 | Discharge: 2019-11-04 | Disposition: A | Discharge status: Home / Self Care | Payer: 59 | Source: Ambulatory Visit | Attending: Medical | Admitting: Medical

## 2019-11-04 DIAGNOSIS — E041 Nontoxic single thyroid nodule: Secondary | ICD-10-CM

## 2019-11-04 DIAGNOSIS — E010 Iodine-deficiency related diffuse (endemic) goiter: Secondary | ICD-10-CM

## 2019-11-11 ENCOUNTER — Other Ambulatory Visit: Payer: Self-pay | Admitting: Medical

## 2019-11-11 MED ORDER — OLMESARTAN MEDOXOMIL 40 MG PO TABS: 40.0000 mg | ORAL_TABLET | Freq: Every day | ORAL | 3 refills | Status: DC

## 2019-12-18 ENCOUNTER — Telehealth: Payer: Self-pay | Admitting: Medical

## 2019-12-18 NOTE — Telephone Encounter (Signed)
I called and left pt a VM she told me she would be asleep because she has to work Midwife. I also asked her to bring that pamphlet by one day next week or today if she can. Im curious to see it also

## 2019-12-18 NOTE — Telephone Encounter (Signed)
Pt called and said when she picked up her prescription for Olmesartan and when she read the pamphlet that comes with it it said the medication may not work for black people. She said she didn't take any of the medication yet but wanted to see if she should start it or did she need something else. She can be reached at [phone].

## 2019-12-18 NOTE — Telephone Encounter (Signed)
All losartan belongs to a drug class called angiotensin receptor blockers.  These are the sisters to ACE inhibitor's like lisinopril.  In general angiotensin receptor blockers or ARBs are blood pressure medicines that tend to work better in African-Americans than the ACE inhibitor.  All blood pressure medications have risks, and there are various reasons why we may use 1 blood pressure pill over another.  I would like to see the pamphlet she has because that is I am surprised that it would say this medicine may not work in black people.  So yes I do recommend she take the olmesartan but I would like to see this pamphlet

## 2019-12-22 ENCOUNTER — Telehealth: Payer: Self-pay | Admitting: Medical

## 2019-12-22 NOTE — Telephone Encounter (Signed)
Please tell her thank you for bringing the form by.    I stand corrected, I guess the form does say it may be less effective in black people  Nevertheless, we simply have this issue: She has uncontrolled blood pressure.  The dangers of not getting blood pressure to goal is damage to the heart and the kidneys and the eyes.   Thus we use medication along with weight loss, healthy diet, limiting salt, eating a Mediterranean diet, reducing stress, all the typical things we do to reduce blood pressure  So I recommend she use the blood pressure medicines we have recommended so far.  Let us plan to recheck in a month to make sure the blood pressure is at goal.  If not at goal at that time we can make adjustments or if not improving at that time, we can send to cardiology and let them figure it out

## 2019-12-23 NOTE — Telephone Encounter (Signed)
Lmom for patient to call the office for message from provider

## 2020-01-13 ENCOUNTER — Other Ambulatory Visit: Payer: Self-pay

## 2020-01-13 ENCOUNTER — Ambulatory Visit (HOSPITAL_COMMUNITY)
Admission: EM | Admit: 2020-01-13 | Discharge: 2020-01-13 | Disposition: A | Discharge status: Home / Self Care | Payer: 59

## 2020-01-13 DIAGNOSIS — R0781 Pleurodynia: Secondary | ICD-10-CM

## 2020-01-13 DIAGNOSIS — M25511 Pain in right shoulder: Secondary | ICD-10-CM

## 2020-01-13 NOTE — Discharge Instructions (Addendum)
Try the voltaren you have at home on the areas of pain  If pain becomes severe, you have nausea, vomiting, shortness of breath or chest pain associated with it, go to the Emergency department   Follow up with your PCP

## 2020-01-13 NOTE — ED Triage Notes (Signed)
Pt presents with right arm pain. States her PCP changed her BP medication approx in June and states the pain started 2 wks after. States she is also having pain in right rib area.

## 2020-01-13 NOTE — ED Provider Notes (Signed)
[place]    CSN: [id number] Arrival date & time: 01/13/20  1252      History   Chief Complaint Chief Complaint  Patient presents with  . Arm Pain    HPI Gina Lopez is a 64 y.o. female.   Patient presents for right shoulder pain that she noticed on waking up this afternoon.  She reports she worked overnight and to sleep without any shoulder pain but woke with intermittent throbbing pain on the outside of her right shoulder.  She did note a little bit of pain that radiated up into her clavicle area.  The clavicle pain is gone away.  Pain has been intermittent and has decreased in intensity.  She reports her periods with the pain is not there.  She denies nausea, vomiting, shortness of breath or chest pain.  She denies injury to the arm.  She denies rashes.  Denies fever and chills.  No strenuous lifting.  Movement does not seem to cause the pain.   She also notes over the last 2 to 3 weeks she has had some right-sided rib pain with movement.  She reports this is rated her bra line.  She wonders if this was related to blood pressure medicine she was started on.  She reports pain only occurs with movement.     Past Medical History:  Diagnosis Date  . Allergy    environmental in spring  . Anemia   . Colon polyp 07/2015   tubular adenoma, Dr. Havery Moros  . Heart murmur    benign, 2013 echo  . History of echocardiogram 02/22/12   mild concentric LVH, EF 55%, left atrial mildly dilated, Dr. Debara Pickett  . Hypertension   . Lymphedema of leg    L>R, sees vein clinic, uses compression pump device at home daily; [facility]  . Obesity   . Wears partial dentures    upper only    Patient Active Problem List   Diagnosis Date Noted  . Alkaline phosphatase elevation 09/04/2019  . Allergic rhinitis due to pollen 09/04/2019  . Leg cramp 09/04/2019  . Digital mucinous cyst of finger of right hand 05/28/2017  . Vaccine counseling 05/28/2017  . Thyroid nodule 05/28/2017  .  Iron deficiency anemia 03/27/2016  . Lymphedema 05/18/2015  . Encounter for health maintenance examination in adult 05/18/2015  . Obesity 05/18/2015  . Special screening for malignant neoplasms, colon 05/18/2015  . Screening for diabetes mellitus 05/18/2015  . Screening for cervical cancer 05/18/2015  . Need for prophylactic vaccination and inoculation against influenza 05/18/2015  . Essential hypertension 04/29/2015  . LVH (left ventricular hypertrophy) 04/29/2015  . Heart murmur 01/09/2012    Past Surgical History:  Procedure Laterality Date  . COLONOSCOPY     age 89yo due to blood in stool, [place]  . COLONOSCOPY  07/2015   tubular adenoma polyp, repeat 5 years, Dr. Havery Moros  . CYST EXCISION  01/14/15   finger  . FINGER SURGERY     reattachment of tip of pinky finger on left hand  . VEIN LIGATION AND STRIPPING     [facility]    OB History   No obstetric history on file.      Home Medications    Prior to Admission medications   Medication Sig Start Date End Date Taking? Authorizing Provider  cholecalciferol (VITAMIN D3) 25 MCG (1000 UNIT) tablet Take 1 tablet (1,000 Units total) by mouth daily. 08/11/19  Yes Tysinger, Camelia Eng, PA-C  iron  polysaccharides (NU-IRON) 150 MG capsule Take 1 capsule (150 mg total) by mouth daily. 08/11/19  Yes Tysinger, Camelia Eng, PA-C  Multiple Vitamins-Minerals (HAIR SKIN AND NAILS FORMULA PO) Take by mouth.   Yes [provider]  olmesartan (BENICAR) 40 MG tablet Take 1 tablet (40 mg total) by mouth daily. 11/11/19  Yes Tysinger, Camelia Eng, PA-C    Family History Family History  Problem Relation Age of Onset  . Arthritis Mother   . Hypertension Mother   . Obesity Mother   . Hip fracture Mother   . Alcohol abuse Father   . Diabetes Brother   . Cancer Sister        breast  . Heart disease Neg Hx   . Stroke Neg Hx   . Colon cancer Neg Hx   . Stomach cancer Neg Hx   . Rectal cancer Neg Hx     Social History Social  History   Tobacco Use  . Smoking status: Former Smoker    Years: 1.00  . Smokeless tobacco: Never Used  Substance Use Topics  . Alcohol use: No  . Drug use: No     Allergies   Codeine and Terbinafine and related   Review of Systems Review of Systems   Physical Exam Triage Vital Signs ED Triage Vitals  Enc Vitals Group     BP 01/13/20 1353 (!) 158/97     Pulse Rate 01/13/20 1353 67     Resp 01/13/20 1353 18     Temp 01/13/20 1353 98.3 F (36.8 C)     Temp Source 01/13/20 1353 Oral     SpO2 01/13/20 1353 100 %     Weight --      Height --      Head Circumference --      Peak Flow --      Pain Score 01/13/20 1351 7     Pain Loc --      Pain Edu? --      Excl. in [place]? --    No data found.  Updated Vital Signs BP (!) 158/97 (BP Location: Right Arm)   Pulse 67   Temp 98.3 F (36.8 C) (Oral)   Resp 18   SpO2 100%   Visual Acuity Right Eye Distance:   Left Eye Distance:   Bilateral Distance:    Right Eye Near:   Left Eye Near:    Bilateral Near:     Physical Exam Vitals and nursing note reviewed.  Constitutional:      General: She is not in acute distress.    Appearance: She is well-developed. She is not ill-appearing.  HENT:     Head: Normocephalic and atraumatic.  Eyes:     Conjunctiva/sclera: Conjunctivae normal.  Cardiovascular:     Rate and Rhythm: Normal rate and regular rhythm.     Heart sounds: No murmur heard.   Pulmonary:     Effort: Pulmonary effort is normal. No respiratory distress.     Breath sounds: Normal breath sounds.  Abdominal:     Palpations: Abdomen is soft.     Tenderness: There is no abdominal tenderness.  Musculoskeletal:     Cervical back: Neck supple.     Right lower leg: No edema.     Left lower leg: No edema.     Comments: No deformity, rash or overlying erythema or ecchymosis of the right shoulder.  Full range of motion of the shoulder.  No tenderness to palpation.  Strength 5/5 in  the upper extremities equal  bilaterally.  No swelling in the upper extremities.  No tenderness of the thoracic cage.  No rash.  Skin:    General: Skin is warm and dry.  Neurological:     Mental Status: She is alert.      UC Treatments / Results  Labs (all labs ordered are listed, but only abnormal results are displayed) Labs Reviewed - No data to display  EKG   Radiology No results found.  Procedures Procedures (including critical care time)  Medications Ordered in UC Medications - No data to display  Initial Impression / Assessment and Plan / UC Course  I have reviewed the triage vital signs and the nursing notes.  Pertinent labs & imaging results that were available during my care of the patient were reviewed by me and considered in my medical decision making (see chart for details).     #Right shoulder pain #Right rib pain Patient is a 64 year old presenting with intermittent right shoulder and right rib pain.  Given no associated symptoms and intermittent nature, likely this is musculoskeletal.  Believe the shoulder pain can be related to sleep posture overnight.  Discussed topical Voltaren and close monitoring.  Given reproducibility with movement of right-sided rib pain, likely musculoskeletal, consider costochondritis.  Patient verbalized understanding agreement.  Instructed patient follow-up with primary care.  Emergency department cautions discussed. Final Clinical Impressions(s) / UC Diagnoses   Final diagnoses:  Acute pain of right shoulder  Rib pain on right side     Discharge Instructions     Try the voltaren you have at home on the areas of pain  If pain becomes severe, you have nausea, vomiting, shortness of breath or chest pain associated with it, go to the Emergency department   Follow up with your PCP       ED Prescriptions    None     PDMP not reviewed this encounter.   Purnell Shoemaker, PA-C 01/13/20 1433

## 2020-01-26 ENCOUNTER — Ambulatory Visit: Payer: 59 | Admitting: Medical

## 2020-01-26 ENCOUNTER — Encounter: Payer: Self-pay | Admitting: Medical

## 2020-01-26 ENCOUNTER — Other Ambulatory Visit: Payer: Self-pay

## 2020-01-26 VITALS — BP 158/80 | HR 84 | Ht 68.0 in | Wt 291.6 lb

## 2020-01-26 DIAGNOSIS — E669 Obesity, unspecified: Secondary | ICD-10-CM

## 2020-01-26 DIAGNOSIS — R748 Abnormal levels of other serum enzymes: Secondary | ICD-10-CM

## 2020-01-26 DIAGNOSIS — E041 Nontoxic single thyroid nodule: Secondary | ICD-10-CM | POA: Diagnosis not present

## 2020-01-26 DIAGNOSIS — I10 Essential (primary) hypertension: Secondary | ICD-10-CM

## 2020-01-26 DIAGNOSIS — E042 Nontoxic multinodular goiter: Secondary | ICD-10-CM | POA: Insufficient documentation

## 2020-01-26 DIAGNOSIS — D509 Iron deficiency anemia, unspecified: Secondary | ICD-10-CM

## 2020-01-26 DIAGNOSIS — M25511 Pain in right shoulder: Secondary | ICD-10-CM

## 2020-01-26 DIAGNOSIS — I517 Cardiomegaly: Secondary | ICD-10-CM | POA: Diagnosis not present

## 2020-01-26 MED ORDER — ATENOLOL 25 MG PO TABS
25.0000 mg | ORAL_TABLET | Freq: Every day | ORAL | 1 refills | Status: DC
Start: 2020-01-26 — End: 2020-03-10

## 2020-01-26 NOTE — Addendum Note (Signed)
Addended by: Carlena Hurl on: 01/26/2020 10:18 AM   Modules accepted: Orders

## 2020-01-26 NOTE — Patient Instructions (Signed)
High blood pressure  Add atenolol 25 mg 1 tablet daily when you start your day  Continue the new medicine olmesartan blood pressure pill, 1 tablet daily when you start your day.  However check to see if the 90-day supply will be the same price.  If not I can change this to losartan before you run out.  If you have a way to check your blood pressure, monitor this 2 days/week.  The goal was 130/80 or less  I do not anticipate any side effects with the medication but if you do start having dizziness or more tired than usual let me know  I am going to refer you to cardiology for recheck on prior history of enlarged left ventricle, recheck on blood pressure and ask them bout screening for vascular disease in the legs when you see them  Limit salt intake  I recommend you exercise at least 150 minutes/week  Cut back on meat and animal product servings as this also impacts your blood pressure   Thyroid nodule  We reviewed your recent ultrasound  Plan to recheck ultrasound in 1 year   Alkaline phosphatase elevation  Your labs have shown abnormal readings for alkaline phosphatase.   This test is nonspecific but can be related to diseases of the bone, intestines, liver, vitamin D deficiency or other  I suspect is a vitamin D deficiency issue  I have been having you take vitamin D supplement  We are going to recheck this lab today   Iron deficiency anemia  Your recent stool test was negative for blood  We are rechecking your blood counts and iron today since she you have been taking iron supplement   Right shoulder pain, likely rotator cuff tendinitis  Begin the therapy exercises that demonstrated to today  Do 30 repetitions each direction daily with resistance bands  Consider using an arm sling 1 to 2 hours at a time to rest the arm  You can use Tylenol as needed for pain up to twice daily  You can use ice water pack topically to the shoulder 20 minutes at a time as  needed  We will call with lab results

## 2020-01-26 NOTE — Progress Notes (Addendum)
Subjective: Chief Complaint  Patient presents with  . Follow-up    blood pressure-fasting labs   . Arm Pain    right x3 weeks-was seen at urgent care for it    Here for recheck on chronic issues.  Hypertension-since last visit we changed to initially Diovan which was too expensive, then olmesartan.  She still complains about the $30 co-pay.  She was on lisinopril HCT in the past but this was not getting her to goal.  She has no side effects of the olmesartan  She does not exercise currently.  She attributes this to working long hours.  works 5 days per week, 12 hour shifts  She does add salt to food some.  She recently had her thyroid ultrasound follow-up  She recently turned in stool cards given history of iron deficiency anemia.  She is compliant with iron supplement for the last several months  She is compliant with vitamin D supplement added in March due to alkaline phosphatase been low and likely cause being vitamin D deficient.  She had some recent right arm pains.  No fall no injury no trauma.  Saw urgent care and they reassured her that nothing was wrong.  But she notes however pain with certain motions overhead and with certain activity.  She is right-handed.  No prior significant shoulder injury she is aware of.  She does not have nighttime pain.  No other new complaints  Past Medical History:  Diagnosis Date  . Allergy    environmental in spring  . Anemia   . Colon polyp 07/2015   tubular adenoma, Dr. Havery Moros  . Heart murmur    benign, 2013 echo  . History of echocardiogram 02/22/12   mild concentric LVH, EF 55%, left atrial mildly dilated, Dr. Debara Pickett  . Hypertension   . Lymphedema of leg    L>R, sees vein clinic, uses compression pump device at home daily; [facility]  . Obesity   . Wears partial dentures    upper only   ROS as in subjective     Objective: BP (!) 158/80   Pulse 84   Ht 5\' 8"  (1.727 m)   Wt 291 lb 9.6 oz (132.3 kg)   SpO2 97%   BMI  44.34 kg/m   Wt Readings from Last 3 Encounters:  01/26/20 291 lb 9.6 oz (132.3 kg)  09/04/19 290 lb 12.8 oz (131.9 kg)  08/07/19 291 lb 9.6 oz (132.3 kg)   BP Readings from Last 3 Encounters:  01/26/20 (!) 158/80  01/13/20 (!) 158/97  09/04/19 (!) 146/78   Gen: wd, wn, nad Extremities: Chronic lower extremity swelling likely lymphedema 2+ pulses upper and lower extremities Prominent arterial pulsation in right neck chronic otherwise thyroid goiter no other abnormality Heart regular rate and rhythm, possible 2 out of 6 holosystolic murmur in right upper sternal border, normal S1-S2 otherwise Right shoulder nontender, mild pain noted with apprehension test, resisted empty can and Neer's test, otherwise unremarkable arm exam Arms neurovascularly intact   Assessment: Encounter Diagnoses  Name Primary?  . Essential hypertension Yes  . LVH (left ventricular hypertrophy)   . Thyroid nodule   . Obesity with serious comorbidity, unspecified classification, unspecified obesity type   . Iron deficiency anemia, unspecified iron deficiency anemia type   . Alkaline phosphatase elevation   . Multinodular goiter   . Acute pain of right shoulder      Plan: Hypertension not at goal.  Prior medications included lisinopril HCT which did not  get her to goal.  She had a lot of cramping recently on other diuretics so we stopped that, Lasix.  We have avoided amlodipine due to the fact she already has chronic lower extremity edema, likely lymphedema.  She complained of the price of the olmesartan today.  She will check insurance about 9-day supply now that she is tolerating this medication.  Diovan was too expensive.  We can change losartan in the next 60 days when she runs out of medication if this is too expensive at 9-day supply (olmesartan).  Add beta-blocker today to help get blood pressure under better control  Referral to cardiology for recheck on LVH, help in managing the blood pressure and  follow-up  I reviewed her May 2021 thyroid ultrasound which was stable.  Plan to recheck ultrasound in 1 year  Patient Instructions  High blood pressure  Add atenolol 25 mg 1 tablet daily when you start your day  Continue the new medicine olmesartan blood pressure pill, 1 tablet daily when you start your day.  However check to see if the 90-day supply will be the same price.  If not I can change this to losartan before you run out.  If you have a way to check your blood pressure, monitor this 2 days/week.  The goal was 130/80 or less  I do not anticipate any side effects with the medication but if you do start having dizziness or more tired than usual let me know  I am going to refer you to cardiology for recheck on prior history of enlarged left ventricle, recheck on blood pressure and ask them bout screening for vascular disease in the legs when you see them  Limit salt intake  I recommend you exercise at least 150 minutes/week  Cut back on meat and animal product servings as this also impacts your blood pressure   Thyroid nodule  We reviewed your recent ultrasound  Plan to recheck ultrasound in 1 year   Alkaline phosphatase elevation  Your labs have shown abnormal readings for alkaline phosphatase.   This test is nonspecific but can be related to diseases of the bone, intestines, liver, vitamin D deficiency or other  I suspect is a vitamin D deficiency issue  I have been having you take vitamin D supplement  We are going to recheck this lab today   Iron deficiency anemia  Your recent stool test was negative for blood  We are rechecking your blood counts and iron today since she you have been taking iron supplement   Right shoulder pain, likely rotator cuff tendinitis  Begin the therapy exercises that demonstrated to today  Do 30 repetitions each direction daily with resistance bands  Consider using an arm sling 1 to 2 hours at a time to rest the arm  You  can use Tylenol as needed for pain up to twice daily  You can use ice water pack topically to the shoulder 20 minutes at a time as needed  We will call with lab results     Suleima was seen today for follow-up and arm pain.  Diagnoses and all orders for this visit:  Essential hypertension -     Ambulatory referral to Cardiology -     Ambulatory referral to Cardiology  LVH (left ventricular hypertrophy) -     Ambulatory referral to Cardiology -     Ambulatory referral to Cardiology  Thyroid nodule  Obesity with serious comorbidity, unspecified classification, unspecified obesity type  Iron deficiency anemia,  unspecified iron deficiency anemia type -     CBC -     Iron  Alkaline phosphatase elevation -     VITAMIN D 25 Hydroxy (Vit-D Deficiency, Fractures) -     Alkaline Phosphatase, Isoenzymes  Multinodular goiter  Acute pain of right shoulder  Other orders -     atenolol (TENORMIN) 25 MG tablet; Take 1 tablet (25 mg total) by mouth daily.

## 2020-01-27 ENCOUNTER — Other Ambulatory Visit: Payer: Self-pay | Admitting: Medical

## 2020-01-28 ENCOUNTER — Telehealth: Payer: Self-pay | Admitting: Gastroenterology

## 2020-01-28 NOTE — Telephone Encounter (Signed)
I have left a message for patient to call back. 

## 2020-01-29 LAB — CBC
Hematocrit: 34.7 % (ref 34.0–46.6)
Hemoglobin: 10.3 g/dL — ABNORMAL LOW (ref 11.1–15.9)
MCH: 24.2 pg — ABNORMAL LOW (ref 26.6–33.0)
MCHC: 29.7 g/dL — ABNORMAL LOW (ref 31.5–35.7)
MCV: 82 fL (ref 79–97)
Platelets: 207 10*3/uL (ref 150–450)
RBC: 4.26 x10E6/uL (ref 3.77–5.28)
RDW: 14.8 % (ref 11.7–15.4)
WBC: 8.4 10*3/uL (ref 3.4–10.8)

## 2020-01-29 LAB — VITAMIN D 25 HYDROXY (VIT D DEFICIENCY, FRACTURES): Vit D, 25-Hydroxy: 23.3 ng/mL — ABNORMAL LOW (ref 30.0–100.0)

## 2020-01-29 LAB — IRON: Iron: 30 ug/dL (ref 27–139)

## 2020-01-29 LAB — ALKALINE PHOSPHATASE, ISOENZYMES
Alkaline Phosphatase: 133 IU/L — ABNORMAL HIGH (ref 48–121)
BONE FRACTION: 28 % (ref 14–68)
INTESTINAL FRAC.: 10 % (ref 0–18)
LIVER FRACTION: 62 % (ref 18–85)

## 2020-01-29 NOTE — Telephone Encounter (Signed)
I have spoken to patient to advise of appointment. She questions why she needs appointment when she has "been anemic all my life." I explained that her anemia has progressively worsened over the years and needs explanation as to why. I explained that sometimes anemia can be caused from abnormalities in the GI tract, from certain disease, from dietary discrepancy but it must first be evaluated. Patient agrees to come for appointment.

## 2020-03-09 NOTE — Progress Notes (Addendum)
Patient referred by Carlena Hurl, PA-C for hypertension  Subjective:   Gina Lopez, female    DOB: Nov 27, 1955, 64 y.o.   MRN: [id number]   Chief Complaint  Patient presents with  . Hypertension  . Heart Murmur  . LVH  . New Patient (Initial Visit)     HPI  64 y.o. 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  Her physical activity is limited to walking at her work.  Outside of work, she does not get temperature during regular physical activity or exercise.  She is history of chronic lymphedema in both legs, starting at age 72 in the right leg, and a 29 her left leg.  She underwent venous ablation on the left leg, with no improvement.  With compression stockings, edema has been stable, but remains more in left leg than right leg.  She has been on olmesartan 40 mg daily for last 3 months, and atenolol 25 mg daily for last 1 month for management of hypertension.  She wonders if olmesartan is not as effective in [place] American patients. She has not noted any improvement.  He does endorse adding salt to her diet, but denies any history suggestive of sleep apnea.  She does not drink alcohol or smoke.   Past Medical History:  Diagnosis Date  . Allergy    environmental in spring  . Anemia   . Colon polyp 07/2015   tubular adenoma, Dr. Havery Moros  . Heart murmur    benign, 2013 echo  . History of echocardiogram 02/22/12   mild concentric LVH, EF 55%, left atrial mildly dilated, Dr. Debara Pickett  . Hypertension   . Lymphedema of leg    L>R, sees vein clinic, uses compression pump device at home daily; [facility]  . Obesity   . Wears partial dentures    upper only     Past Surgical History:  Procedure Laterality Date  . COLONOSCOPY     age 33yo due to blood in stool, [place]  . COLONOSCOPY  07/2015   tubular adenoma polyp, repeat 5 years, Dr. Havery Moros  . CYST EXCISION  01/14/15   finger  . FINGER  SURGERY     reattachment of tip of pinky finger on left hand  . VEIN LIGATION AND STRIPPING     [facility]    Social History   Tobacco Use  Smoking Status Former Smoker  . Packs/day: 0.25  . Years: 1.00  . Pack years: 0.25  . Types: Cigarettes  . Quit date: 35  . Years since quitting: 38.7  Smokeless Tobacco Never Used    Social History   Substance and Sexual Activity  Alcohol Use No     Family History  Problem Relation Age of Onset  . Arthritis Mother   . Hypertension Mother   . Obesity Mother   . Hip fracture Mother   . Alcohol abuse Father   . Diabetes Brother   . Cancer Sister        breast  . Heart disease Neg Hx   . Stroke Neg Hx   . Colon cancer Neg Hx   . Stomach cancer Neg Hx   . Rectal cancer Neg Hx      Current Outpatient Medications on File Prior to Visit  Medication Sig Dispense Refill  . atenolol (TENORMIN) 25 MG tablet Take 1 tablet (25 mg total) by mouth daily. 30 tablet 1  . cholecalciferol (VITAMIN  D3) 25 MCG (1000 UNIT) tablet Take 1 tablet (1,000 Units total) by mouth daily. 90 tablet 3  . iron polysaccharides (NU-IRON) 150 MG capsule Take 1 capsule (150 mg total) by mouth daily. 90 capsule 3  . olmesartan (BENICAR) 40 MG tablet Take 1 tablet (40 mg total) by mouth daily. 90 tablet 3   No current facility-administered medications on file prior to visit.    Cardiovascular and other pertinent studies:  EKG 03/10/2020: Sinus rhythm 65 bpm Normal EKG  EKG 07/2019: Sinus rhythm. Normal EKG  Recent labs: 01/26/2020: H/H 10.3/34.7. MCV 82. Platelets 207  08/2019: Glucose 86, BUN/Cr 14/0.76. EGFR 85. Na/K 141/3.7. Rest of the CMP normal TSH 1.2 normal  2018: HbA1C 6.1%   Review of Systems  Cardiovascular: Positive for leg swelling. Negative for chest pain, dyspnea on exertion, palpitations and syncope.         Vitals:   03/10/20 0853  BP: (!) 148/78  Pulse: 87  Resp: 19  SpO2: 98%     Body mass index is  43.12 kg/m. Filed Weights   03/10/20 0853  Weight: 292 lb (132.5 kg)     Objective:   Physical Exam Vitals and nursing note reviewed.  Constitutional:      General: She is not in acute distress. Neck:     Vascular: No JVD.  Cardiovascular:     Rate and Rhythm: Normal rate and regular rhythm.     Heart sounds: Murmur heard. High-pitched blowing holosystolic murmur is present with a grade of 2/6 at the apex.   Pulmonary:     Effort: Pulmonary effort is normal.     Breath sounds: Normal breath sounds. No wheezing or rales.           Assessment & Recommendations:   64 y.o. African-American female with chronic lymphedema, hypertension  Hypertension: Likely primary hypertension.  No significant improvement on atenolol 25 mg and olmesartan 40 mg daily.  This patient readily noted, is/ARB medications may not be as effective in African-American patients.  Therefore, historically medications.  Started amlodipine 10 mg, and chlorthalidone 50 mg daily.  Blood pressure not controlled, could increase chlorthalidone To 50 mg daily, or add spironolactone. Next step would be to evaluate vasodilating beta-blocker, such as carvedilol. Will obtain echocardiogram. Counseled patient re: low salt and calorie negative diet and increasing physical activity.    Thank you for referring the patient to [place]. Please feel free to contact with any questions.   Nigel Mormon, MD Pager: [phone] Office: [phone]

## 2020-03-10 ENCOUNTER — Ambulatory Visit (INDEPENDENT_AMBULATORY_CARE_PROVIDER_SITE_OTHER): Payer: 59 | Admitting: Cardiology

## 2020-03-10 ENCOUNTER — Encounter: Payer: Self-pay | Admitting: Cardiology

## 2020-03-10 ENCOUNTER — Other Ambulatory Visit: Payer: Self-pay

## 2020-03-10 VITALS — BP 148/78 | HR 87 | Resp 19 | Ht 69.0 in | Wt 292.0 lb

## 2020-03-10 DIAGNOSIS — I517 Cardiomegaly: Secondary | ICD-10-CM

## 2020-03-10 DIAGNOSIS — Z13220 Encounter for screening for lipoid disorders: Secondary | ICD-10-CM

## 2020-03-10 DIAGNOSIS — I10 Essential (primary) hypertension: Secondary | ICD-10-CM

## 2020-03-10 MED ORDER — CHLORTHALIDONE 25 MG PO TABS
25.0000 mg | ORAL_TABLET | Freq: Every day | ORAL | 2 refills | Status: DC
Start: 2020-03-10 — End: 2020-04-08

## 2020-03-10 MED ORDER — AMLODIPINE BESYLATE 10 MG PO TABS
10.0000 mg | ORAL_TABLET | Freq: Every day | ORAL | 2 refills | Status: DC
Start: 2020-03-10 — End: 2020-04-08

## 2020-03-14 ENCOUNTER — Other Ambulatory Visit (HOSPITAL_COMMUNITY): Payer: Self-pay | Admitting: Cardiology

## 2020-03-15 ENCOUNTER — Other Ambulatory Visit: Payer: Self-pay

## 2020-03-15 ENCOUNTER — Ambulatory Visit: Payer: 59

## 2020-03-15 DIAGNOSIS — I10 Essential (primary) hypertension: Secondary | ICD-10-CM

## 2020-03-15 LAB — LIPID PANEL
Chol/HDL Ratio: 2.5 ratio (ref 0.0–4.4)
Cholesterol, Total: 146 mg/dL (ref 100–199)
HDL: 59 mg/dL (ref >39)
LDL Chol Calc (NIH): 75 mg/dL (ref 0–99)
Triglycerides: 56 mg/dL (ref 0–149)
VLDL Cholesterol Cal: 12 mg/dL (ref 5–40)

## 2020-03-15 LAB — BASIC METABOLIC PANEL
BUN/Creatinine Ratio: 17 (ref 12–28)
BUN: 16 mg/dL (ref 8–27)
CO2: 24 mmol/L (ref 20–29)
Calcium: 9.8 mg/dL (ref 8.7–10.3)
Chloride: 102 mmol/L (ref 96–106)
Creatinine, Ser: 0.93 mg/dL (ref 0.57–1.00)
GFR calc Af Amer: 76 mL/min/{1.73_m2} (ref >59)
GFR calc non Af Amer: 66 mL/min/{1.73_m2} (ref >59)
Glucose: 94 mg/dL (ref 65–99)
Potassium: 3.7 mmol/L (ref 3.5–5.2)
Sodium: 140 mmol/L (ref 134–144)

## 2020-04-05 ENCOUNTER — Ambulatory Visit: Payer: 59 | Admitting: Gastroenterology

## 2020-04-07 ENCOUNTER — Telehealth: Payer: Self-pay | Admitting: Gastroenterology

## 2020-04-07 ENCOUNTER — Other Ambulatory Visit (INDEPENDENT_AMBULATORY_CARE_PROVIDER_SITE_OTHER): Payer: 59

## 2020-04-07 ENCOUNTER — Ambulatory Visit: Payer: 59 | Admitting: Gastroenterology

## 2020-04-07 ENCOUNTER — Encounter: Payer: Self-pay | Admitting: Gastroenterology

## 2020-04-07 VITALS — BP 124/70 | HR 76 | Ht 67.25 in | Wt 283.2 lb

## 2020-04-07 DIAGNOSIS — Z86010 Personal history of colonic polyps: Secondary | ICD-10-CM

## 2020-04-07 DIAGNOSIS — Z791 Long term (current) use of non-steroidal anti-inflammatories (NSAID): Secondary | ICD-10-CM

## 2020-04-07 DIAGNOSIS — R748 Abnormal levels of other serum enzymes: Secondary | ICD-10-CM

## 2020-04-07 DIAGNOSIS — D509 Iron deficiency anemia, unspecified: Secondary | ICD-10-CM

## 2020-04-07 LAB — GAMMA GT: GGT: 20 U/L (ref 7–51)

## 2020-04-07 LAB — CBC WITH DIFFERENTIAL/PLATELET
Basophils Absolute: 0.1 10*3/uL (ref 0.0–0.1)
Basophils Relative: 1.1 % (ref 0.0–3.0)
Eosinophils Absolute: 0.3 10*3/uL (ref 0.0–0.7)
Eosinophils Relative: 3.4 % (ref 0.0–5.0)
HCT: 34.9 % — ABNORMAL LOW (ref 36.0–46.0)
Hemoglobin: 11 g/dL — ABNORMAL LOW (ref 12.0–15.0)
Lymphocytes Relative: 22 % (ref 12.0–46.0)
Lymphs Abs: 1.9 10*3/uL (ref 0.7–4.0)
MCHC: 31.6 g/dL (ref 30.0–36.0)
MCV: 77.3 fl — ABNORMAL LOW (ref 78.0–100.0)
Monocytes Absolute: 0.5 10*3/uL (ref 0.1–1.0)
Monocytes Relative: 5.9 % (ref 3.0–12.0)
Neutro Abs: 5.9 10*3/uL (ref 1.4–7.7)
Neutrophils Relative %: 67.6 % (ref 43.0–77.0)
Platelets: 215 10*3/uL (ref 150.0–400.0)
RBC: 4.52 Mil/uL (ref 3.87–5.11)
RDW: 15.2 % (ref 11.5–15.5)
WBC: 8.7 10*3/uL (ref 4.0–10.5)

## 2020-04-07 LAB — HEPATIC FUNCTION PANEL
ALT: 13 U/L (ref 0–35)
AST: 11 U/L (ref 0–37)
Albumin: 4.4 g/dL (ref 3.5–5.2)
Alkaline Phosphatase: 128 U/L — ABNORMAL HIGH (ref 39–117)
Bilirubin, Direct: 0.1 mg/dL (ref 0.0–0.3)
Total Bilirubin: 0.6 mg/dL (ref 0.2–1.2)
Total Protein: 7.7 g/dL (ref 6.0–8.3)

## 2020-04-07 LAB — IBC + FERRITIN
Ferritin: 25.6 ng/mL (ref 10.0–291.0)
Iron: 42 ug/dL (ref 42–145)
Saturation Ratios: 9.6 % — ABNORMAL LOW (ref 20.0–50.0)
Transferrin: 311 mg/dL (ref 212.0–360.0)

## 2020-04-07 NOTE — Telephone Encounter (Signed)
Patient has been scheduled for an endo/colon on 05/19/20 at 4 PM - ok to book double at this time per Dr. Havery Moros.   Spoke with patient and she is aware of procedure date and time. Patient has been scheduled for a pre-visit on 04/18/20 at 4:30 PM. Patient verbalized understanding of instructions and had no concerns at the end of the call

## 2020-04-07 NOTE — Progress Notes (Signed)
HPI :  64 y/o female with a history of iron deficiency anemia, colon polyps, HTN, referred by Chana Bode PA for iron deficiency anemia and elevated alkaline phosphatase. She was last seen here in Feb 2017 for screening colonoscopy.  The patient states she has had iron deficiency anemia for several years. On review of her labs she has had a fluctuating Hgb from 10-11s over the past 4 years. She has been on oral iron chronically from what she endorses. She has had low total iron levels in the past, last deficient in March at which time the iron level is 25.  No ferritin checked.  Most recently her hemoglobin was 10.3 last August, with an MCV of 82 and iron level of 30.  She had stools negative for occult blood in March 2021.  Her last colonoscopy was with me in February 2017, she had 1 small adenoma removed, otherwise exam was normal.  She denies any blood in her stools that is obvious to her outside of 1 scant episode recently.  Her stools are normally on the dark side due to chronic iron.  She normally has regular bowel habits roughly 2-3 bowel movements per day.  More recently she has had some slight constipation.  She denies any abdominal pains.  No reflux symptoms, no dysphagia.  No nausea or vomiting.  She no longer has any menstrual cycles.  When questioning her about over-the-counter medications, she uses Aleve frequently for knee pains.  She states she occasionally will take this daily, multiple times per day.  She is been using this longstanding for knee pain.  She denies any family history of colon cancer or stomach cancer.  She does not take any antacids.  Otherwise she has been noted to have an elevated alkaline phosphatase since the beginning of this year.  Alkaline phosphatase level mildly elevated to 130s, AST, ALT, bilirubin normal.  This was fractionated in a normal distribution back in August.  She denies any personal history of liver disease.  No imaging of her liver available or  on file..   Colonoscopy 07/30/15 - A 29mm sessile rectal polyp was noted and removed via cold snare. Three x 30mm sessile polyps were noted in the rectosigmoid colon and removed via cold snare. The remainder of the examined colon was normal. Retroflexed views revealed internal hemorrhoids. The time to cecum = 3.1 Withdrawal time = 11.1 The scope was withdrawn and the procedure completed. COMPLICATIONS: There were no immediate complications. ENDOSCOPIC IMPRESSION: 4 small colon polyps removed from the left colon as above Internal hemorrhoids  Diagnosis Surgical [P], rectosigmoid, rectal, polyp (3) - TUBULAR ADENOMA, 1 FRAGMENT. - HYPERPLASTIC POLYP WITH ASSOCIATED BENIGN LYMPHOID AGGREGATE, 2 FRAGMENTS. - NO HIGH GRADE DYSPLASIA OR MALIGNANCY IDENTIFIED.  Echo 03/15/20 - EF 55%, grade I DD     Past Medical History:  Diagnosis Date  . Allergy    environmental in spring  . Anemia   . Colon polyp 07/2015   tubular adenoma, Dr. Havery Moros  . Heart murmur    benign, 2013 echo  . History of echocardiogram 02/22/12   mild concentric LVH, EF 55%, left atrial mildly dilated, Dr. Debara Pickett  . Hypertension   . Lymphedema of leg    L>R, sees vein clinic, uses compression pump device at home daily; [facility]in Clinic  . Obesity   . Wears partial dentures    upper only     Past Surgical History:  Procedure Laterality Date  . COLONOSCOPY  age 54yo due to blood in stool, [place]  . COLONOSCOPY  07/2015   tubular adenoma polyp, repeat 5 years, Dr. Havery Moros  . CYST EXCISION  01/14/15   finger  . FINGER SURGERY     reattachment of tip of pinky finger on left hand  . VEIN LIGATION AND STRIPPING     [facility]   Family History  Problem Relation Age of Onset  . Arthritis Mother   . Hypertension Mother   . Obesity Mother   . Hip fracture Mother   . Kidney disease Mother   . Alcohol abuse Father   . Diabetes Brother   . Cancer Sister        type unknown  . Heart disease Neg  Hx   . Stroke Neg Hx   . Colon cancer Neg Hx   . Stomach cancer Neg Hx   . Rectal cancer Neg Hx    Social History   Tobacco Use  . Smoking status: Former Smoker    Packs/day: 0.25    Years: 1.00    Pack years: 0.25    Types: Cigarettes    Quit date: 1983    Years since quitting: 38.8  . Smokeless tobacco: Never Used  Vaping Use  . Vaping Use: Never used  Substance Use Topics  . Alcohol use: Yes    Comment: rare  . Drug use: No   Current Outpatient Medications  Medication Sig Dispense Refill  . amLODipine (NORVASC) 10 MG tablet Take 1 tablet (10 mg total) by mouth daily. 30 tablet 2  . chlorthalidone (HYGROTON) 25 MG tablet Take 1 tablet (25 mg total) by mouth daily. 30 tablet 2  . cholecalciferol (VITAMIN D3) 25 MCG (1000 UNIT) tablet Take 1 tablet (1,000 Units total) by mouth daily. 90 tablet 3  . iron polysaccharides (NU-IRON) 150 MG capsule Take 1 capsule (150 mg total) by mouth daily. 90 capsule 3   No current facility-administered medications for this visit.   Allergies  Allergen Reactions  . Codeine Other (See Comments)    Pt states it makes her feel dizzy and stay in her system forever  . Terbinafine And Related Hives and Rash     Review of Systems: All systems reviewed and negative except where noted in HPI.    PCV ECHOCARDIOGRAM COMPLETE  Result Date: 03/20/2020 Echocardiogram 03/15/2020: Normal LV systolic function with EF 55%. Left ventricle cavity is normal in size. Normal global wall motion. Doppler evidence of grade I (impaired) diastolic dysfunction, normal LAP. Calculated EF 55%. Left atrial cavity is mildly dilated at 3.9 cm. Atrial septum is aneurysmal without obvious patent foramen ovale.  CBC Latest Ref Rng & Units 01/26/2020 08/07/2019 05/28/2017  WBC 3.4 - 10.8 x10E3/uL 8.4 9.9 6.6  Hemoglobin 11.1 - 15.9 g/dL 10.3(L) 10.0(L) 11.3(L)  Hematocrit 34.0 - 46.6 % 34.7 33.3(L) 36.7  Platelets 150 - 450 x10E3/uL 207 209 228    Lab Results    Component Value Date   CREATININE 0.93 03/14/2020   BUN 16 03/14/2020   NA 140 03/14/2020   K 3.7 03/14/2020   CL 102 03/14/2020   CO2 24 03/14/2020    Lab Results  Component Value Date   ALT 12 09/04/2019   AST 14 09/04/2019   ALKPHOS 133 (H) 01/26/2020   BILITOT 0.3 09/04/2019     Physical Exam: BP 124/70 (BP Location: Left Arm, Patient Position: Sitting, Cuff Size: Large)   Pulse 76   Ht 5' 7.25" (1.708 m)  Comment: height measured without shoes  Wt 283 lb 4 oz (128.5 kg)   BMI 44.03 kg/m  Constitutional: Pleasant,well-developed, female in no acute distress. HEENT: Normocephalic and atraumatic. Conjunctivae are normal. No scleral icterus. Neck supple.  Cardiovascular: Normal rate, regular rhythm.  Pulmonary/chest: Effort normal and breath sounds normal. No wheezing, rales or rhonchi. Abdominal: Soft, nondistended, nontender. . There are no masses palpable. . Extremities: no edema Lymphadenopathy: No cervical adenopathy noted. Neurological: Alert and oriented to person place and time. Skin: Skin is warm and dry. No rashes noted. Psychiatric: Normal mood and affect. Behavior is normal.   ASSESSMENT AND PLAN: 64 year old female here in consultation regarding the following:  Iron deficiency anemia / history of colon polyps / NSAID use - patient endorses this chronically, see evidence of this at least for a few years and review of her chart, very mild decrease in total iron with mild anemia, hemoglobin ranging from 10-11.  MCV low 80s to high 70s.  I discussed differential diagnosis for iron deficiency anemia with her.  As above, she is using quite a bit of NSAIDs on a routine basis for knee pain, this could definitely be related to her iron deficiency, discussed with her this can cause peptic ulcer disease and gastritis that she may not feel.  I recommend that she stop all NSAID use for now while we sort this out.  I am recommending an upper endoscopy and a colonoscopy to  reevaluate her bowel, clear her upper tract as this is never been evaluated, rule out NSAID related changes and other pathology.  Her colonoscopy is almost 5 years ago, recommend colonoscopy at the same time to clear her lower tract.  I discussed risk and benefits of these exams and anesthesia with her and she wanted to proceed.  Further recommendations pending the results.  She will continue iron supplementation and stop NSAIDs in the interim.  Elevated alkaline phosphatase - mildly elevated over the past year, AST and ALT are normal.  Isoenzyme breakdown appears normal, this could be coming from her liver.  Will repeat LFTs to see where this is trending, send GGT as well as AMA.  If GGT is elevated, will further evaluate with an ultrasound of the right upper quadrant, also consider additional autoimmune markers.  She agreed, I will contact her with results of lab test.  [place] Cellar, MD [facility]  CC: Glade Lloyd Camelia Eng, PA-C

## 2020-04-07 NOTE — Patient Instructions (Addendum)
If you are age 62 or older, your body mass index should be between 23-30. Your Body mass index is 44.03 kg/m. If this is out of the aforementioned range listed, please consider follow up with your Primary Care Provider.  If you are age 37 or younger, your body mass index should be between 19-25. Your Body mass index is 44.03 kg/m. If this is out of the aformentioned range listed, please consider follow up with your Primary Care Provider.   It has been recommended to you by your physician that you have a(n) EGD/Colonoscopy completed. Per your request, we did not schedule the procedure(s) today. Please contact our office at [phone] when you have checked your schedule and are ready to schedule your procedures. You will be scheduled for a pre-visit and procedure at that time.  You can explain when you call back that Dr.Armbruster indicated he will do your ECL in a 4:00pm spot.   Please go to the lab in the basement of our building to have lab work done as you leave today. Hit "B" for basement when you get on the elevator.  When the doors open the lab is on your left.  We will call you with the results. Thank you.  Due to recent changes in healthcare laws, you may see the results of your imaging and laboratory studies on MyChart before your provider has had a chance to review them.  We understand that in some cases there may be results that are confusing or concerning to you. Not all laboratory results come back in the same time frame and the provider may be waiting for multiple results in order to interpret others.  Please give [place] 48 hours in order for your provider to thoroughly review all the results before contacting the office for clarification of your results.   Please discontinue Aleve.  Thank you for entrusting me with your care and for choosing [facility], Dr. [place] Cellar

## 2020-04-08 ENCOUNTER — Other Ambulatory Visit: Payer: Self-pay

## 2020-04-08 ENCOUNTER — Encounter: Payer: Self-pay | Admitting: Cardiology

## 2020-04-08 ENCOUNTER — Ambulatory Visit: Payer: 59 | Admitting: Cardiology

## 2020-04-08 VITALS — BP 129/74 | HR 76 | Resp 16 | Ht 67.25 in | Wt 284.0 lb

## 2020-04-08 DIAGNOSIS — I10 Essential (primary) hypertension: Secondary | ICD-10-CM

## 2020-04-08 LAB — MITOCHONDRIAL ANTIBODIES: Mitochondrial M2 Ab, IgG: <=20 U

## 2020-04-08 MED ORDER — AMLODIPINE BESYLATE 10 MG PO TABS: 10.0000 mg | ORAL_TABLET | Freq: Every day | ORAL | 1 refills | Status: DC

## 2020-04-08 MED ORDER — CHLORTHALIDONE 25 MG PO TABS: 25.0000 mg | ORAL_TABLET | Freq: Every day | ORAL | 1 refills | Status: DC

## 2020-04-08 MED ORDER — ATENOLOL 25 MG PO TABS: 25.0000 mg | ORAL_TABLET | Freq: Every day | ORAL | 1 refills | Status: DC

## 2020-04-08 NOTE — Progress Notes (Signed)
Patient referred by Carlena Hurl, PA-C for hypertension  Subjective:   Gina Lopez, female    DOB: 02-21-1956, 64 y.o.   MRN: [id number]   Chief Complaint  Patient presents with  . LVH (left ventricular hypertrophy)  . Follow-up    4 week     HPI  64 y.o. African-American female with chronic lymphedema, hypertension, iron deficiency anemia  Blood pressure is not very well controlled on current antihypertensive therapy.  Patient is feeling well and does not have any complaints.  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  Her physical activity is limited to walking at her work.  Outside of work, she does not get temperature during regular physical activity or exercise.  She is history of chronic lymphedema in both legs, starting at age 25 in the right leg, and a 29 her left leg.  She underwent venous ablation on the left leg, with no improvement.  With compression stockings, edema has been stable, but remains more in left leg than right leg.  She has been on olmesartan 40 mg daily for last 3 months, and atenolol 25 mg daily for last 1 month for management of hypertension.  She wonders if olmesartan is not as effective in [place] American patients. She has not noted any improvement.  He does endorse adding salt to her diet, but denies any history suggestive of sleep apnea.  She does not drink alcohol or smoke.    Current Outpatient Medications on File Prior to Visit  Medication Sig Dispense Refill  . amLODipine (NORVASC) 10 MG tablet Take 1 tablet (10 mg total) by mouth daily. 30 tablet 2  . chlorthalidone (HYGROTON) 25 MG tablet Take 1 tablet (25 mg total) by mouth daily. 30 tablet 2  . cholecalciferol (VITAMIN D3) 25 MCG (1000 UNIT) tablet Take 1 tablet (1,000 Units total) by mouth daily. 90 tablet 3  . iron polysaccharides (NU-IRON) 150 MG capsule Take 1 capsule (150 mg total) by mouth daily. 90 capsule 3  . atenolol (TENORMIN) 25  MG tablet Take 1 tablet by mouth daily.     No current facility-administered medications on file prior to visit.    Cardiovascular and other pertinent studies:  EKG 03/10/2020: Sinus rhythm 65 bpm Normal EKG  EKG 07/2019: Sinus rhythm. Normal EKG  Recent labs: 01/26/2020: H/H 10.3/34.7. MCV 82. Platelets 207  08/2019: Glucose 86, BUN/Cr 14/0.76. EGFR 85. Na/K 141/3.7. Rest of the CMP normal TSH 1.2 normal  2018: HbA1C 6.1%   Review of Systems  Cardiovascular: Negative for chest pain, dyspnea on exertion, leg swelling, palpitations and syncope.        Vitals:   04/08/20 0933  BP: 129/74  Pulse: 76  Resp: 16  SpO2: 99%     Body mass index is 44.15 kg/m. Filed Weights   04/08/20 0933  Weight: 284 lb (128.8 kg)     Objective:   Physical Exam Vitals and nursing note reviewed.  Constitutional:      General: She is not in acute distress. Neck:     Vascular: No JVD.  Cardiovascular:     Rate and Rhythm: Normal rate and regular rhythm.     Heart sounds: Murmur heard. High-pitched blowing holosystolic murmur is present with a grade of 2/6 at the apex.   Pulmonary:     Effort: Pulmonary effort is normal.     Breath sounds: Normal breath sounds. No wheezing or rales.  Assessment & Recommendations:   64 y.o. African-American female with chronic lymphedema, hypertension, iron deficiency anemia  Hypertension: Very well controlled on on amlodipine 10 mg, and chlorthalidone 25 mg daily, atenolol 25 mg daily. Refilled for 3 months. Further refills can be done by PCP.  I will see her on as needed basis.    Nigel Mormon, MD Pager: [phone] Office: [phone]

## 2020-04-19 ENCOUNTER — Ambulatory Visit (AMBULATORY_SURGERY_CENTER): Payer: Self-pay | Admitting: *Deleted

## 2020-04-19 ENCOUNTER — Other Ambulatory Visit: Payer: Self-pay

## 2020-04-19 VITALS — Ht 67.25 in | Wt 282.0 lb

## 2020-04-19 DIAGNOSIS — D509 Iron deficiency anemia, unspecified: Secondary | ICD-10-CM

## 2020-04-19 DIAGNOSIS — Z86010 Personal history of colonic polyps: Secondary | ICD-10-CM

## 2020-04-19 MED ORDER — SUTAB 1479-225-188 MG PO TABS: 24.0000 | ORAL_TABLET | ORAL | 0 refills | Status: DC

## 2020-04-19 NOTE — Progress Notes (Signed)

## 2020-04-25 ENCOUNTER — Encounter: Payer: Self-pay | Admitting: Gastroenterology

## 2020-05-19 ENCOUNTER — Other Ambulatory Visit: Payer: Self-pay

## 2020-05-19 ENCOUNTER — Encounter: Payer: Self-pay | Admitting: Gastroenterology

## 2020-05-19 ENCOUNTER — Ambulatory Visit (AMBULATORY_SURGERY_CENTER): Payer: 59 | Admitting: Gastroenterology

## 2020-05-19 VITALS — BP 132/73 | HR 73 | Temp 97.5°F | Resp 18 | Ht 67.25 in | Wt 282.0 lb

## 2020-05-19 DIAGNOSIS — K635 Polyp of colon: Secondary | ICD-10-CM

## 2020-05-19 DIAGNOSIS — K2950 Unspecified chronic gastritis without bleeding: Secondary | ICD-10-CM | POA: Diagnosis not present

## 2020-05-19 DIAGNOSIS — K5730 Diverticulosis of large intestine without perforation or abscess without bleeding: Secondary | ICD-10-CM | POA: Diagnosis not present

## 2020-05-19 DIAGNOSIS — K2970 Gastritis, unspecified, without bleeding: Secondary | ICD-10-CM | POA: Diagnosis not present

## 2020-05-19 DIAGNOSIS — K319 Disease of stomach and duodenum, unspecified: Secondary | ICD-10-CM | POA: Diagnosis not present

## 2020-05-19 DIAGNOSIS — Z86010 Personal history of colonic polyps: Secondary | ICD-10-CM

## 2020-05-19 DIAGNOSIS — K648 Other hemorrhoids: Secondary | ICD-10-CM

## 2020-05-19 DIAGNOSIS — D509 Iron deficiency anemia, unspecified: Secondary | ICD-10-CM | POA: Diagnosis not present

## 2020-05-19 DIAGNOSIS — D123 Benign neoplasm of transverse colon: Secondary | ICD-10-CM

## 2020-05-19 MED ORDER — SODIUM CHLORIDE 0.9 % IV SOLN: 500.0000 mL | Freq: Once | INTRAVENOUS | Status: DC

## 2020-05-19 NOTE — Op Note (Signed)
[name]: Gina Lopez Procedure Date: 05/19/2020 3:34 PM MRN: [id number] Endoscopist: Remo Lipps P. Havery Moros , MD Age: 64 Referring MD:  Date of Birth: July 16, 1955 Gender: Female Account #: [id number] Procedure:                Upper GI endoscopy Indications:              Iron deficiency anemia, history of frequent use of                            NSAIDs (Aleve) Medicines:                Monitored Anesthesia Care Procedure:                Pre-Anesthesia Assessment:                           - Prior to the procedure, a History and Physical                            was performed, and patient medications and                            allergies were reviewed. The patient's tolerance of                            previous anesthesia was also reviewed. The risks                            and benefits of the procedure and the sedation                            options and risks were discussed with the patient.                            All questions were answered, and informed consent                            was obtained. Prior Anticoagulants: The patient has                            taken no previous anticoagulant or antiplatelet                            agents. ASA Grade Assessment: III - A patient with                            severe systemic disease. After reviewing the risks                            and benefits, the patient was deemed in                            satisfactory condition to undergo the procedure.  After obtaining informed consent, the endoscope was                            passed under direct vision. Throughout the                            procedure, the patient's blood pressure, pulse, and                            oxygen saturations were monitored continuously. The                            Endoscope was introduced through the mouth, and                            advanced to the second part of  duodenum. The upper                            GI endoscopy was accomplished without difficulty.                            The patient tolerated the procedure well. Scope In: Scope Out: Findings:                 Esophagogastric landmarks were identified: the                            Z-line was found at 36 cm, the gastroesophageal                            junction was found at 36 cm and the upper extent of                            the gastric folds was found at 36 cm from the                            incisors.                           The exam of the esophagus was otherwise normal.                           Patchy mild inflammation characterized by erosions                            (one) and erythema was found in the gastric antrum.                           The exam of the stomach was otherwise normal.                           Biopsies were taken with a cold forceps in the  gastric body, at the incisura and in the gastric                            antrum for Helicobacter pylori testing.                           The duodenal bulb and second portion of the                            duodenum were normal. Complications:            No immediate complications. Estimated blood loss:                            Minimal. Estimated Blood Loss:     Estimated blood loss was minimal. Impression:               - Esophagogastric landmarks identified.                           - Normal esophagus                           - Mild gastritis.                           - Normal stomach otherwise, biopsies taken to rule                            out H pylori                           - Normal duodenal bulb and second portion of the                            duodenum.                           NSAID use could be causing iron deficienc - mild                            gastritis on this exam, perhaps affecting small                            bowel as  well. Recommendation:           - Patient has a contact number available for                            emergencies. The signs and symptoms of potential                            delayed complications were discussed with the                            patient. Return to normal activities tomorrow.  Written discharge instructions were provided to the                            patient.                           - Resume previous diet.                           - Continue present medications.                           - Minimize / avoid NSAID use                           - Trial of omeprazole 20mg  / day for 1 month                           - Await pathology results.                           - Continue iron, trend hemoglobin Remo Lipps P. [name], MD 05/19/2020 4:29:59 PM This report has been signed electronically.

## 2020-05-19 NOTE — Progress Notes (Signed)
Vitals-CW  Pt's states no medical or surgical changes since previsit or office visit. 

## 2020-05-19 NOTE — Progress Notes (Signed)
A/ox3, pleased with MAC, report to RN 

## 2020-05-19 NOTE — Op Note (Signed)
[place] Patient Name: Gina Lopez Procedure Date: 05/19/2020 3:33 PM MRN: [id number] Endoscopist: Remo Lipps P. Havery Moros , MD Age: 64 Referring MD:  Date of Birth: [birth year] Gender: Female Account #: [id number] Procedure:                Colonoscopy Indications:              Iron deficiency anemia, history of colon polyps Medicines:                Monitored Anesthesia Care Procedure:                Pre-Anesthesia Assessment:                           - Prior to the procedure, a History and Physical                            was performed, and patient medications and                            allergies were reviewed. The patient's tolerance of                            previous anesthesia was also reviewed. The risks                            and benefits of the procedure and the sedation                            options and risks were discussed with the patient.                            All questions were answered, and informed consent                            was obtained. Prior Anticoagulants: The patient has                            taken no previous anticoagulant or antiplatelet                            agents. ASA Grade Assessment: III - A patient with                            severe systemic disease. After reviewing the risks                            and benefits, the patient was deemed in                            satisfactory condition to undergo the procedure.                           After obtaining informed consent, the colonoscope  was passed under direct vision. Throughout the                            procedure, the patient's blood pressure, pulse, and                            oxygen saturations were monitored continuously. The                            Colonoscope was introduced through the anus and                            advanced to the the terminal ileum, with                            identification  of the appendiceal orifice and IC                            valve. The colonoscopy was performed without                            difficulty. The patient tolerated the procedure                            well. The quality of the bowel preparation was                            good. The terminal ileum, ileocecal valve,                            appendiceal orifice, and rectum were photographed. Scope In: 4:06:24 PM Scope Out: 4:20:58 PM Scope Withdrawal Time: 0 hours 11 minutes 9 seconds  Total Procedure Duration: 0 hours 14 minutes 34 seconds  Findings:                 The perianal and digital rectal examinations were                            normal.                           The terminal ileum appeared normal.                           A 4 mm polyp was found in the transverse colon. The                            polyp was sessile. The polyp was removed with a                            cold snare. Resection and retrieval were complete.                           A few small-mouthed diverticula were found in the  ascending colon.                           Internal hemorrhoids were found during retroflexion.                           The exam was otherwise without abnormality. Complications:            No immediate complications. Estimated blood loss:                            Minimal. Estimated Blood Loss:     Estimated blood loss was minimal. Impression:               - The examined portion of the ileum was normal.                           - One 4 mm polyp in the transverse colon, removed                            with a cold snare. Resected and retrieved.                           - Diverticulosis in the ascending colon.                           - Internal hemorrhoids.                           - The examination was otherwise normal.                           No cause for iron deficiency on this exam. See EGD                            note for  details of that exam and recommendations Recommendation:           - Patient has a contact number available for                            emergencies. The signs and symptoms of potential                            delayed complications were discussed with the                            patient. Return to normal activities tomorrow.                            Written discharge instructions were provided to the                            patient.                           - Resume previous diet.                           -  Continue present medications.                           - Await pathology results. Remo Lipps P. Gina Kruczek, MD 05/19/2020 4:24:58 PM This report has been signed electronically.

## 2020-05-19 NOTE — Progress Notes (Signed)
Called to room to assist during endoscopic procedure.  Patient ID and intended procedure confirmed with present staff. Received instructions for my participation in the procedure from the performing physician.  

## 2020-05-19 NOTE — Patient Instructions (Signed)
Handouts given: polyps, diverticulitis Resume previous diet  continue present medications Minimize or avoid NSAID use, ie; aspirin, aleve, naproxen or motrin Start omeprazole 20mg  by mouth for 1 month Await pathology results Continue iron   YOU HAD AN ENDOSCOPIC PROCEDURE TODAY AT [place]:   Refer to the procedure report that was given to you for any specific questions about what was found during the examination.  If the procedure report does not answer your questions, please call your gastroenterologist to clarify.  If you requested that your care partner not be given the details of your procedure findings, then the procedure report has been included in a sealed envelope for you to review at your convenience later.  YOU SHOULD EXPECT: Some feelings of bloating in the abdomen. Passage of more gas than usual.  Walking can help get rid of the air that was put into your GI tract during the procedure and reduce the bloating. If you had a lower endoscopy (such as a colonoscopy or flexible sigmoidoscopy) you may notice spotting of blood in your stool or on the toilet paper. If you underwent a bowel prep for your procedure, you may not have a normal bowel movement for a few days.  Please Note:  You might notice some irritation and congestion in your nose or some drainage.  This is from the oxygen used during your procedure.  There is no need for concern and it should clear up in a day or so.  SYMPTOMS TO REPORT IMMEDIATELY:   Following lower endoscopy (colonoscopy or flexible sigmoidoscopy):  Excessive amounts of blood in the stool  Significant tenderness or worsening of abdominal pains  Swelling of the abdomen that is new, acute  Fever of 100F or higher   Following upper endoscopy (EGD)  Vomiting of blood or coffee ground material  New chest pain or pain under the shoulder blades  Painful or persistently difficult swallowing  New shortness of breath  Fever of 100F or  higher  Black, tarry-looking stools  For urgent or emergent issues, a gastroenterologist can be reached at any hour by calling [phone]. Do not use MyChart messaging for urgent concerns.   DIET:  We do recommend a small meal at first, but then you may proceed to your regular diet.  Drink plenty of fluids but you should avoid alcoholic beverages for 24 hours.  ACTIVITY:  You should plan to take it easy for the rest of today and you should NOT DRIVE or use heavy machinery until tomorrow (because of the sedation medicines used during the test).    FOLLOW UP: Our staff will call the number listed on your records 48-72 hours following your procedure to check on you and address any questions or concerns that you may have regarding the information given to you following your procedure. If we do not reach you, we will leave a message.  We will attempt to reach you two times.  During this call, we will ask if you have developed any symptoms of COVID 19. If you develop any symptoms (ie: fever, flu-like symptoms, shortness of breath, cough etc.) before then, please call [phone].  If you test positive for Covid 19 in the 2 weeks post procedure, please call and report this information to [place].    If any biopsies were taken you will be contacted by phone or by letter within the next 1-3 weeks.  Please call us at [phone] if you have not heard about the biopsies in  3 weeks.   SIGNATURES/CONFIDENTIALITY: You and/or your care partner have signed paperwork which will be entered into your electronic medical record.  These signatures attest to the fact that that the information above on your After Visit Summary has been reviewed and is understood.  Full responsibility of the confidentiality of this discharge information lies with you and/or your care-partner.

## 2020-05-23 ENCOUNTER — Telehealth: Payer: Self-pay

## 2020-05-23 NOTE — Telephone Encounter (Signed)
  Follow up Call-  Call back number 05/19/2020  Post procedure Call Back phone  # [phone]  Permission to leave phone message Yes  Some recent data might be hidden     Patient questions:  Do you have a fever, pain , or abdominal swelling? No. Pain Score  0 *  Have you tolerated food without any problems? Yes.    Have you been able to return to your normal activities? Yes.    Do you have any questions about your discharge instructions: Diet   No. Medications  No. Follow up visit  No.  Do you have questions or concerns about your Care? No.  Actions: * If pain score is 4 or above: No action needed, pain <4.  1. Have you developed a fever since your procedure? No  2.   Have you had an respiratory symptoms (SOB or cough) since your procedure? No 3.   Have you tested positive for COVID 19 since your procedure No 4.   Have you had any family members/close contacts diagnosed with the COVID 19 since your procedure?  No   If yes to any of these questions please route to Joylene John, RN and Joella Prince, RN

## 2020-05-25 ENCOUNTER — Other Ambulatory Visit: Payer: Self-pay

## 2020-05-25 DIAGNOSIS — D509 Iron deficiency anemia, unspecified: Secondary | ICD-10-CM

## 2020-05-25 MED ORDER — OMEPRAZOLE 20 MG PO CPDR: 20.0000 mg | DELAYED_RELEASE_CAPSULE | Freq: Every day | ORAL | 3 refills | Status: DC

## 2020-05-30 DIAGNOSIS — M7662 Achilles tendinitis, left leg: Secondary | ICD-10-CM | POA: Insufficient documentation

## 2020-06-10 ENCOUNTER — Other Ambulatory Visit: Payer: Self-pay | Admitting: Cardiology

## 2020-06-10 DIAGNOSIS — I10 Essential (primary) hypertension: Secondary | ICD-10-CM

## 2020-07-05 ENCOUNTER — Other Ambulatory Visit: Payer: Self-pay | Admitting: Medical

## 2020-07-05 DIAGNOSIS — Z1231 Encounter for screening mammogram for malignant neoplasm of breast: Secondary | ICD-10-CM

## 2020-07-06 ENCOUNTER — Telehealth: Payer: Self-pay

## 2020-07-06 NOTE — Telephone Encounter (Signed)
-----   Message from Yevette Edwards, RN sent at 05/25/2020  2:19 PM EST ----- Regarding: Labs Repeat CBC, IBC + Ferritin. Order in epic

## 2020-07-06 NOTE — Telephone Encounter (Signed)
Spoke with patient to remind her that she is due for repeat labs this week. Advised patient that no appointment is necessary, she is aware that she can stop by the lab in the basement of our office building between 7:30 AM - 5 PM, Monday through Friday. Patient verbalized understanding and had no concerns at the end of the call.

## 2020-07-07 ENCOUNTER — Other Ambulatory Visit (INDEPENDENT_AMBULATORY_CARE_PROVIDER_SITE_OTHER): Payer: 59

## 2020-07-07 DIAGNOSIS — D509 Iron deficiency anemia, unspecified: Secondary | ICD-10-CM

## 2020-07-07 LAB — CBC WITH DIFFERENTIAL/PLATELET
Basophils Absolute: 0.1 K/uL (ref 0.0–0.1)
Basophils Relative: 1.3 % (ref 0.0–3.0)
Eosinophils Absolute: 0.1 K/uL (ref 0.0–0.7)
Eosinophils Relative: 0.8 % (ref 0.0–5.0)
HCT: 35.2 % — ABNORMAL LOW (ref 36.0–46.0)
Hemoglobin: 10.9 g/dL — ABNORMAL LOW (ref 12.0–15.0)
Lymphocytes Relative: 18.5 % (ref 12.0–46.0)
Lymphs Abs: 2.1 K/uL (ref 0.7–4.0)
MCHC: 31.1 g/dL (ref 30.0–36.0)
MCV: 76.2 fl — ABNORMAL LOW (ref 78.0–100.0)
Monocytes Absolute: 0.6 K/uL (ref 0.1–1.0)
Monocytes Relative: 5.7 % (ref 3.0–12.0)
Neutro Abs: 8.3 K/uL — ABNORMAL HIGH (ref 1.4–7.7)
Neutrophils Relative %: 73.7 % (ref 43.0–77.0)
Platelets: 259 K/uL (ref 150.0–400.0)
RBC: 4.61 Mil/uL (ref 3.87–5.11)
RDW: 15.5 % (ref 11.5–15.5)
WBC: 11.3 K/uL — ABNORMAL HIGH (ref 4.0–10.5)

## 2020-07-07 LAB — IBC + FERRITIN
Ferritin: 24.6 ng/mL (ref 10.0–291.0)
Iron: 41 ug/dL — ABNORMAL LOW (ref 42–145)
Saturation Ratios: 9.2 % — ABNORMAL LOW (ref 20.0–50.0)
Transferrin: 318 mg/dL (ref 212.0–360.0)

## 2020-07-08 ENCOUNTER — Telehealth: Payer: Self-pay | Admitting: Gastroenterology

## 2020-07-08 NOTE — Telephone Encounter (Signed)
Spoke with patient, see 07/07/20 result note for more information 

## 2020-07-08 NOTE — Telephone Encounter (Signed)
Patient is returning your call.  

## 2020-07-27 ENCOUNTER — Ambulatory Visit: Payer: 59 | Admitting: Gastroenterology

## 2020-07-27 ENCOUNTER — Other Ambulatory Visit (INDEPENDENT_AMBULATORY_CARE_PROVIDER_SITE_OTHER): Payer: 59

## 2020-07-27 ENCOUNTER — Encounter: Payer: Self-pay | Admitting: Gastroenterology

## 2020-07-27 VITALS — BP 140/80 | HR 72 | Ht 67.0 in | Wt 282.0 lb

## 2020-07-27 DIAGNOSIS — D509 Iron deficiency anemia, unspecified: Secondary | ICD-10-CM | POA: Diagnosis not present

## 2020-07-27 DIAGNOSIS — R748 Abnormal levels of other serum enzymes: Secondary | ICD-10-CM | POA: Diagnosis not present

## 2020-07-27 LAB — CREATININE, SERUM: Creatinine, Ser: 0.95 mg/dL (ref 0.40–1.20)

## 2020-07-27 LAB — BUN: BUN: 18 mg/dL (ref 6–23)

## 2020-07-27 NOTE — Patient Instructions (Addendum)
If you are age 63 or older, your body mass index should be between 23-30. Your Body mass index is 44.17 kg/m. If this is out of the aforementioned range listed, please consider follow up with your Primary Care Provider.  If you are age 75 or younger, your body mass index should be between 19-25. Your Body mass index is 44.17 kg/m. If this is out of the aformentioned range listed, please consider follow up with your Primary Care Provider.   Please go to the lab in the basement of our building to have lab work done as you leave today. Hit "B" for basement when you get on the elevator.  When the doors open the lab is on your left.  We will call you with the results. Thank you.   You have been scheduled for an CT Enterography Abdomen and Pelvis at [facility], 1st floor, Radiology. Your appointment is scheduled on Tuesday, 2-22 at 5:30pm. Please arrive at 4:00pm to get registered and to drink contrast. Please make certain not to have anything to eat or drink 4 hours prior to your test after 1:30pm.  Should you need to reschedule, please call [phone].  Increase your oral iron to twice a day if tolerated.   Thank you for entrusting me with your care and for choosing [facility], Dr. [place] Cellar

## 2020-07-27 NOTE — Progress Notes (Signed)
HPI :  65 y/o female here for follow-up visit for iron deficiency anemia, elevated alk phos.  See prior consult note in October for details of her history.  She has had iron deficiency anemia for several years, her hemoglobin fluctuates between tens and 11's.  She has been on oral iron chronically.  She has not had a menstrual period since her late 81s.  She denies any blood in her stools at all.  She has dark green stools at baseline on chronic iron.  She does have some constipation at times but managed with diet.  She denies any fatigue or symptoms from her anemia.  She does have significant orthopedic complaints with multiple joint pains.  During the time of our evaluation at the last visit she was taking Aleve routinely.  I asked her to stop this and she has not had any Aleve since I have seen her.  She is not taking any other NSAIDs.  She underwent an EGD and colonoscopy with me, as outlined below, no clear cause to explain her iron deficiency.  I asked her to hold all NSAIDs and stay on oral iron and repeat her blood work a few months later. On January 27 of this year she had a hemoglobin of 10.9, MCV of 76, iron level of 41 with an iron sat of 9%.  Ferritin of 24.  We had discussed doing a capsule endoscopy or other work-up over the phone but she declined.  She is here to discuss options.  Otherwise she has been noted to have an elevated alkaline phosphatase since the beginning of this year.  Alkaline phosphatase level mildly elevated to 130s, AST, ALT, bilirubin normal.  This was fractionated in a normal distribution back in August.  She denies any personal history of liver disease.  She has had negative antimitochondrial antibodies.  LFTs repeated last October which showed an elevated alk phos of 128 with a normal GGT of 20  Prior workup: Colonoscopy 07/30/15 - A 26m sessile rectal polyp was noted and removed via cold snare. Three x 32msessile polyps were noted in the rectosigmoid colon and  removed via cold snare. The remainder of the examined colon was normal. Retroflexed views revealed internal hemorrhoids. The time to cecum = 3.1 Withdrawal time = 11.1 The scope was withdrawn and the procedure completed. COMPLICATIONS: There were no immediate complications. ENDOSCOPIC IMPRESSION: 4 small colon polyps removed from the left colon as above Internal hemorrhoids  Diagnosis Surgical [P], rectosigmoid, rectal, polyp (3) - TUBULAR ADENOMA, 1 FRAGMENT. - HYPERPLASTIC POLYP WITH ASSOCIATED BENIGN LYMPHOID AGGREGATE, 2 FRAGMENTS. - NO HIGH GRADE DYSPLASIA OR MALIGNANCY IDENTIFIED.  Echo 03/15/20 - EF 55%, grade I DD   EGD 05/19/20 -  - The exam of the esophagus was otherwise normal. - Patchy mild inflammation characterized by erosions (one) and erythema was found in the gastric antrum. - The exam of the stomach was otherwise normal. - Biopsies were taken with a cold forceps in the gastric body, at the incisura and in the gastric antrum for Helicobacter pylori testing. - The duodenal bulb and second portion of the duodenum were normal.  Colonoscopy 05/19/20 - The perianal and digital rectal examinations were normal. - The terminal ileum appeared normal. - A 4 mm polyp was found in the transverse colon. The polyp was sessile. The polyp was removed with a cold snare. Resection and retrieval were complete. - A few small-mouthed diverticula were found in the ascending colon. - Internal hemorrhoids were found  during retroflexion. - The exam was otherwise without abnormality.   1. Surgical [P], gastric antrum, gastric body - MILD CHRONIC GASTRITIS. MILD REACTIVE GASTROPATHY - WARTHIN-STARRY STAIN IS NEGATIVE FOR HELICOBACTER PYLORI. 2. Surgical [P], colon, transverse, polyp (1) - SESSILE SERRATED POLYP WITHOUT CYTOLOGIC DYSPLASIA.    Past Medical History:  Diagnosis Date  . Allergy    environmental in spring  . Anemia   . Arthritis    knees  . Colon polyp 07/2015    tubular adenoma, Dr. Havery Moros  . Heart murmur    benign, 2013 echo  . History of echocardiogram 02/22/12   mild concentric LVH, EF 55%, left atrial mildly dilated, Dr. Debara Pickett  . Hypertension   . Lymphedema of leg    L>R, sees vein clinic, uses compression pump device at home daily; Vein Clinic  . Obesity   . Wears partial dentures    upper only     Past Surgical History:  Procedure Laterality Date  . COLONOSCOPY     age 49yo due to blood in stool, [place]  . COLONOSCOPY  07/2015   tubular adenoma polyp, repeat 5 years, Dr. Havery Moros  . CYST EXCISION  01/14/15   finger  . FINGER SURGERY     reattachment of tip of pinky finger on left hand  . POLYPECTOMY    . VEIN LIGATION AND STRIPPING     [facility]   Family History  Problem Relation Age of Onset  . Arthritis Mother   . Hypertension Mother   . Obesity Mother   . Hip fracture Mother   . Kidney disease Mother   . Alcohol abuse Father   . Diabetes Brother   . Cancer Sister        type unknown  . Heart disease Neg Hx   . Stroke Neg Hx   . Colon cancer Neg Hx   . Stomach cancer Neg Hx   . Rectal cancer Neg Hx   . Colon polyps Neg Hx    Social History   Tobacco Use  . Smoking status: Former Smoker    Packs/day: 0.25    Years: 1.00    Pack years: 0.25    Types: Cigarettes    Quit date: 1983    Years since quitting: 39.1  . Smokeless tobacco: Never Used  Vaping Use  . Vaping Use: Never used  Substance Use Topics  . Alcohol use: Yes    Comment: rare  . Drug use: No   Current Outpatient Medications  Medication Sig Dispense Refill  . atenolol (TENORMIN) 25 MG tablet Take 1 tablet (25 mg total) by mouth daily. 90 tablet 1  . chlorthalidone (HYGROTON) 25 MG tablet Take 1 tablet by mouth once daily 30 tablet 0  . cholecalciferol (VITAMIN D3) 25 MCG (1000 UNIT) tablet Take 1 tablet (1,000 Units total) by mouth daily. 90 tablet 3  . iron polysaccharides (NU-IRON) 150 MG capsule Take 1 capsule (150 mg  total) by mouth daily. 90 capsule 3  . omeprazole (PRILOSEC) 20 MG capsule Take 1 capsule (20 mg total) by mouth daily. 90 capsule 3  . amLODipine (NORVASC) 10 MG tablet Take 1 tablet (10 mg total) by mouth daily. 90 tablet 1   No current facility-administered medications for this visit.   Allergies  Allergen Reactions  . Codeine Other (See Comments)    Pt states it makes her feel dizzy and stay in her system forever  . Terbinafine And Related Hives and Rash  Review of Systems: All systems reviewed and negative except where noted in HPI.   Lab Results  Component Value Date   WBC 11.3 (H) 07/07/2020   HGB 10.9 (L) 07/07/2020   HCT 35.2 (L) 07/07/2020   MCV 76.2 (L) 07/07/2020   PLT 259.0 07/07/2020    CBC Latest Ref Rng & Units 07/07/2020 04/07/2020 01/26/2020  WBC 4.0 - 10.5 K/uL 11.3(H) 8.7 8.4  Hemoglobin 12.0 - 15.0 g/dL 10.9(L) 11.0(L) 10.3(L)  Hematocrit 36.0 - 46.0 % 35.2(L) 34.9(L) 34.7  Platelets 150.0 - 400.0 K/uL 259.0 215.0 207    Lab Results  Component Value Date   IRON 41 (L) 07/07/2020   TIBC 338 03/27/2016   FERRITIN 24.6 07/07/2020     Physical Exam: BP 140/80   Pulse 72   Ht '5\' 7"'  (1.702 m)   Wt 282 lb (127.9 kg)   SpO2 98%   BMI 44.17 kg/m  Constitutional: Pleasant,well-developed, female in no acute distress. Neurological: Alert and oriented to person place and time. Psychiatric: Normal mood and affect. Behavior is normal.   ASSESSMENT AND PLAN: 65 year old female here for reassessment of the following:  Iron deficiency anemia Elevated alkaline phosphatase  As above, longstanding chronic iron deficiency anemia for years with mild anemia.  She has been menopausal since her late 41s.  She had endorsed significant NSAID use recently.  EGD and colonoscopy without clear cause for this.  I had her stop all NSAIDs as suspected this could be causing small bowel erosions/ulcerations perhaps the likely cause.  She stopped NSAIDs and has maintained  oral iron supplementation and her anemia persists with mild deficiency.  We discussed options in this setting.  I recommended ideally we do a capsule endoscopy to clarify any pathology in the small intestine.  I discussed risks and benefits of this.  She would consider this however she works as a Presenter, broadcasting at night and does not think she logistically could wear capsule on her belt where she already is carrying other things.  Ultimately she does not think this is a good option for her in that light.  I discussed other options to include an enterography study to ensure no mass lesion of the small bowel and assess for any inflammatory changes.  She has not had any prior abdominal imaging.  After discussion of options she preferred to do a CT enterography.  We will await that result.  She should continue iron and asked her to increase this to twice daily if she can tolerate it, can use MiraLAX for constipation.  If enterography study is negative and her iron deficiency persists despite oral iron we will refer her to hematology.  She is in agreement with this.  She understands CT may be limited in regards to detecting small vascular lesions such as AVMs, etc. Otherwise discussed her work-up for the elevated alk phos.  GGT is normal, and the AP is just over the limit of normal.  I do not feel strongly that this warrants any additional evaluation at this time and can survey. She agreed  Plan: - CT enterography to clear small bowel of any concerning or high risk pathology.  Logistically she does not think she can do a capsule study so we will proceed with imaging - increase oral iron to twice daily if tolerated, can use MiraLAX for constipation if needed - we will continue to avoid NSAIDs for now until work-up is complete - consideration for hematology consult pending her course - do not feel  that further work-up for elevated alk phos is warranted at this time but will monitor  Daykin Cellar, MD [facility]  Gastroenterology

## 2020-08-02 ENCOUNTER — Ambulatory Visit (HOSPITAL_COMMUNITY): Payer: 59

## 2020-08-05 ENCOUNTER — Other Ambulatory Visit: Payer: Self-pay

## 2020-08-05 ENCOUNTER — Encounter (HOSPITAL_COMMUNITY): Payer: Self-pay

## 2020-08-05 ENCOUNTER — Ambulatory Visit (HOSPITAL_COMMUNITY)
Admission: RE | Admit: 2020-08-05 | Discharge: 2020-08-05 | Disposition: A | Discharge status: Home / Self Care | Payer: 59 | Source: Ambulatory Visit | Attending: Gastroenterology | Admitting: Gastroenterology

## 2020-08-05 DIAGNOSIS — R748 Abnormal levels of other serum enzymes: Secondary | ICD-10-CM | POA: Diagnosis present

## 2020-08-05 DIAGNOSIS — D509 Iron deficiency anemia, unspecified: Secondary | ICD-10-CM

## 2020-08-05 MED ORDER — BARIUM SULFATE 0.1 % PO SUSP
ORAL | Status: AC
  Administered 2020-08-05: 1500 mL via ORAL
  Filled 2020-08-05: qty 3

## 2020-08-05 MED ORDER — IOHEXOL 300 MG/ML  SOLN
100.0000 mL | Freq: Once | INTRAMUSCULAR | Status: AC | PRN
  Administered 2020-08-05: 100 mL via INTRAVENOUS

## 2020-08-07 ENCOUNTER — Other Ambulatory Visit: Payer: Self-pay | Admitting: Cardiology

## 2020-08-07 DIAGNOSIS — I10 Essential (primary) hypertension: Secondary | ICD-10-CM

## 2020-08-09 ENCOUNTER — Other Ambulatory Visit: Payer: Self-pay

## 2020-08-09 ENCOUNTER — Encounter: Payer: Self-pay | Admitting: Medical

## 2020-08-09 ENCOUNTER — Telehealth: Payer: Self-pay | Admitting: Medical

## 2020-08-09 ENCOUNTER — Ambulatory Visit: Payer: 59 | Admitting: Medical

## 2020-08-09 ENCOUNTER — Other Ambulatory Visit (HOSPITAL_COMMUNITY)
Admission: RE | Admit: 2020-08-09 | Discharge: 2020-08-09 | Disposition: A | Discharge status: Home / Self Care | Payer: 59 | Source: Ambulatory Visit | Attending: Medical | Admitting: Medical

## 2020-08-09 VITALS — BP 146/80 | HR 79 | Ht 67.0 in | Wt 283.6 lb

## 2020-08-09 DIAGNOSIS — M722 Plantar fascial fibromatosis: Secondary | ICD-10-CM

## 2020-08-09 DIAGNOSIS — I890 Lymphedema, not elsewhere classified: Secondary | ICD-10-CM

## 2020-08-09 DIAGNOSIS — D509 Iron deficiency anemia, unspecified: Secondary | ICD-10-CM

## 2020-08-09 DIAGNOSIS — Z0000 Encounter for general adult medical examination without abnormal findings: Secondary | ICD-10-CM

## 2020-08-09 DIAGNOSIS — Z124 Encounter for screening for malignant neoplasm of cervix: Secondary | ICD-10-CM | POA: Diagnosis not present

## 2020-08-09 DIAGNOSIS — Z6841 Body Mass Index (BMI) 40.0 and over, adult: Secondary | ICD-10-CM

## 2020-08-09 DIAGNOSIS — M2141 Flat foot [pes planus] (acquired), right foot: Secondary | ICD-10-CM

## 2020-08-09 DIAGNOSIS — E042 Nontoxic multinodular goiter: Secondary | ICD-10-CM

## 2020-08-09 DIAGNOSIS — Z780 Asymptomatic menopausal state: Secondary | ICD-10-CM

## 2020-08-09 DIAGNOSIS — I10 Essential (primary) hypertension: Secondary | ICD-10-CM

## 2020-08-09 DIAGNOSIS — M79671 Pain in right foot: Secondary | ICD-10-CM

## 2020-08-09 DIAGNOSIS — Z7185 Encounter for immunization safety counseling: Secondary | ICD-10-CM | POA: Diagnosis not present

## 2020-08-09 DIAGNOSIS — M79672 Pain in left foot: Secondary | ICD-10-CM

## 2020-08-09 DIAGNOSIS — I517 Cardiomegaly: Secondary | ICD-10-CM

## 2020-08-09 DIAGNOSIS — Z23 Encounter for immunization: Secondary | ICD-10-CM | POA: Diagnosis not present

## 2020-08-09 DIAGNOSIS — M2142 Flat foot [pes planus] (acquired), left foot: Secondary | ICD-10-CM

## 2020-08-09 DIAGNOSIS — R011 Cardiac murmur, unspecified: Secondary | ICD-10-CM

## 2020-08-09 DIAGNOSIS — E2839 Other primary ovarian failure: Secondary | ICD-10-CM

## 2020-08-09 MED ORDER — VITAMIN D 25 MCG (1000 UNIT) PO TABS: 1000.0000 [IU] | ORAL_TABLET | Freq: Every day | ORAL | 3 refills | Status: DC

## 2020-08-09 NOTE — Progress Notes (Signed)
Subjective:   HPI  Gina Lopez is a 65 y.o. female who presents for Chief Complaint  Patient presents with  . Annual Exam    Physical with fasting labs     Patient Care Team: Viggo Perko, Leward Quan as PCP - General (Family Medicine) Sees dentist Sees eye doctor Dr. [place] Cellar, GI Dr. Vernell Leep, cardiology Dr. Ila Mcgill, podiatry [facility] Dr. Merri Ray, vascular  Concerns: Hypertension- patient states before Atenolol was added she felt like pressures were better.  When she saw cardiology in September, olmesartan was discontinued, Atenolol was continued, and Amlodipine and Chlorthalidone was added.  She still gets 140s or higher SBP but DBP normal.     Seeing GI currently about anemia, just had iron doubled, and may end up seeing hematology.  Just recently had colonoscopy  Seeing ortho.  Has bone spur left heel bothering her but both feet hurt in bottom when not active.  When moving around not so bad.    Past Medical History:  Diagnosis Date  . Allergy    environmental in spring  . Anemia   . Arthritis    knees  . Colon polyp 07/2015   tubular adenoma, Dr. Havery Moros  . Heart murmur    benign, 2013 echo  . History of echocardiogram 02/22/12   mild concentric LVH, EF 55%, left atrial mildly dilated, Dr. Debara Pickett  . Hypertension   . Lymphedema of leg    L>R, sees vein clinic, uses compression pump device at home daily; Vein Clinic  . Obesity   . Wears partial dentures    upper only    Family History  Problem Relation Age of Onset  . Arthritis Mother   . Hypertension Mother   . Obesity Mother   . Hip fracture Mother   . Kidney disease Mother   . Alcohol abuse Father   . Diabetes Brother   . Cancer Sister        type unknown  . Heart disease Neg Hx   . Stroke Neg Hx   . Colon cancer Neg Hx   . Stomach cancer Neg Hx   . Rectal cancer Neg Hx   . Colon polyps Neg Hx      Current Outpatient Medications:  .  amLODipine (NORVASC)  10 MG tablet, Take 1 tablet (10 mg total) by mouth daily., Disp: 90 tablet, Rfl: 1 .  atenolol (TENORMIN) 25 MG tablet, Take 1 tablet (25 mg total) by mouth daily., Disp: 90 tablet, Rfl: 1 .  chlorthalidone (HYGROTON) 25 MG tablet, TAKE 1 TABLET BY MOUTH ONCE DAILY . APPOINTMENT REQUIRED FOR FUTURE REFILLS, Disp: 30 tablet, Rfl: 0 .  iron polysaccharides (NU-IRON) 150 MG capsule, Take 1 capsule (150 mg total) by mouth daily., Disp: 90 capsule, Rfl: 3 .  omeprazole (PRILOSEC) 20 MG capsule, Take 1 capsule (20 mg total) by mouth daily., Disp: 90 capsule, Rfl: 3 .  cholecalciferol (VITAMIN D3) 25 MCG (1000 UNIT) tablet, Take 1 tablet (1,000 Units total) by mouth daily., Disp: 90 tablet, Rfl: 3  Allergies  Allergen Reactions  . Codeine Other (See Comments)    Pt states it makes her feel dizzy and stay in her system forever  . Terbinafine And Related Hives and Rash    Reviewed their medical, surgical, family, social, medication, and allergy history and updated chart as appropriate.   Review of Systems Constitutional: -fever, -chills, -sweats, -unexpected weight change, -decreased appetite, -fatigue Allergy: -sneezing, -itching, -congestion Dermatology: -changing moles, --rash, -lumps  ENT: -runny nose, -ear pain, -sore throat, -hoarseness, -sinus pain, -teeth pain, - ringing in ears, -hearing loss, -nosebleeds Cardiology: -chest pain, -palpitations, -swelling, -difficulty breathing when lying flat, -waking up short of breath Respiratory: -cough, -shortness of breath, -difficulty breathing with exercise or exertion, -wheezing, -coughing up blood Gastroenterology: -abdominal pain, -nausea, -vomiting, -diarrhea, -constipation, -blood in stool, -changes in bowel movement, -difficulty swallowing or eating Hematology: -bleeding, -bruising  Musculoskeletal: -joint aches, +muscle aches, -joint swelling, -back pain, -neck pain, -cramping, -changes in gait Ophthalmology: denies vision changes, eye  redness, itching, discharge Urology: -burning with urination, -difficulty urinating, -blood in urine, -urinary frequency, -urgency, -incontinence Neurology: -headache, -weakness, -tingling, -numbness, -memory loss, -falls, -dizziness Psychology: -depressed mood, -agitation, -sleep problems Breast/gyn: -breast tendnerss, -discharge, -lumps, -vaginal discharge,- irregular periods, -heavy periods     Objective:  BP (!) 146/80   Pulse 79   Ht 5\' 7"  (1.702 m)   Wt 283 lb 9.6 oz (128.6 kg)   SpO2 97%   BMI 44.42 kg/m   BP Readings from Last 3 Encounters:  08/09/20 (!) 146/80  07/27/20 140/80  05/19/20 132/73   Wt Readings from Last 3 Encounters:  08/09/20 283 lb 9.6 oz (128.6 kg)  07/27/20 282 lb (127.9 kg)  05/19/20 282 lb (127.9 kg)    General appearance: alert, no distress, WD/WN, African American female Skin: unremarkable HEENT: normocephalic, conjunctiva/corneas normal, sclerae anicteric, PERRLA, EOMi, nares patent, no discharge or erythema, pharynx normal Oral cavity: MMM, tongue normal, teeth normal Neck: supple, no lymphadenopathy, no thyromegaly, no masses, normal ROM, no bruits Chest: non tender, normal shape and expansion Heart: RRR, normal S1, S2, no murmurs Lungs: CTA bilaterally, no wheezes, rhonchi, or rales Abdomen: +bs, soft, non tender, non distended, no masses, no hepatomegaly, no splenomegaly, no bruits Back: non tender, normal ROM, no scoliosis Musculoskeletal: flat feet bilat, tender left posterior calcaneus, otherwise upper extremities non tender, no obvious deformity, normal ROM throughout, lower extremities non tender, no obvious deformity, normal ROM throughout Extremities: generalized lymphedema of bilat LE, no cyanosis, no clubbing Pulses: 2+ symmetric, upper and lower extremities, normal cap refill Neurological: alert, oriented x 3, CN2-12 intact, strength normal upper extremities and lower extremities, sensation normal throughout, DTRs 2+ throughout, no  cerebellar signs, gait normal Psychiatric: normal affect, behavior normal, pleasant  Breast: nontender, no masses or lumps, no skin changes, no nipple discharge or inversion, no axillary lymphadenopathy Gyn: Normal external genitalia without lesions, vagina with normal mucosa, there is a mild cystocele, cervix without lesions, no cervical motion tenderness, no abnormal vaginal discharge.  Uterus and adnexa not enlarged, nontender, no masses.  Pap performed.  Exam chaperoned by nurse. Rectal: anus normal appearing    Assessment and Plan :   Encounter Diagnoses  Name Primary?  . Encounter for health maintenance examination in adult Yes  . Need for influenza vaccination   . Vaccine counseling   . Screening for cervical cancer   . Class 3 severe obesity with serious comorbidity and body mass index (BMI) of 40.0 to 44.9 in adult, unspecified obesity type ([place])   . Multinodular goiter   . Lymphedema   . LVH (left ventricular hypertrophy)   . Iron deficiency anemia, unspecified iron deficiency anemia type   . Heart murmur   . Essential hypertension   . Pain in both feet   . Post-menopausal   . Estrogen deficiency   . Flat feet   . Plantar fasciitis     Today you had a preventative care visit or wellness  visit.    Topics today may have included healthy lifestyle, diet, exercise, preventative care, vaccinations, sick and well care, proper use of emergency dept and after hours care, as well as other concerns.     Recommendations: Continue to return yearly for your annual wellness and preventative care visits.  This gives [place] a chance to discuss healthy lifestyle, exercise, vaccinations, review your chart record, and perform screenings where appropriate.  I recommend you see your eye doctor yearly for routine vision care.  I recommend you see your dentist yearly for routine dental care including hygiene visits twice yearly.   Vaccination recommendations were reviewed You report being up  to date on covid   Shingles vaccine:  I recommend you have a shingles vaccine to help prevent shingles or herpes zoster outbreak.   Please call your insurer to inquire about coverage for the Shingrix vaccine given in 2 doses.   Some insurers cover this vaccine after age 90, some cover this after age 27.  If your insurer covers this, then call to schedule appointment to have this vaccine here.  Tetanus vaccine is up to date  We recommend a yearly flu shot in the fall  Pneumococcal vaccine will be due at age 54yo    Screening for cancer: Breast cancer screening: You should perform a self breast exam monthly.   We reviewed recommendations for regular mammograms and breast cancer screening.  Colon cancer screening:  I reviewed your colonoscopy on file that is up to date from 05/2020, repeat in 5-7 years  Cervical cancer screening: We reviewed recommendations for pap smear screening.  Skin cancer screening: Check your skin regularly for new changes, growing lesions, or other lesions of concern Come in for evaluation if you have skin lesions of concern.  Lung cancer screening: If you have a greater than 30 pack year history of tobacco use, then you qualify for lung cancer screening with a chest CT scan  We currently don't have screenings for other cancers besides breast, cervical, colon, and lung cancers.  If you have a strong family history of cancer or have other cancer screening concerns, please let me know.    Bone health: Get at least 150 minutes of aerobic exercise weekly Get weight bearing exercise at least once weekly Call insurance to check coverage for bone density screening, diagnosis -  Post menopausal estrogen deficiency  Heart health: Get at least 150 minutes of aerobic exercise weekly Limit alcohol It is important to maintain a healthy blood pressure and healthy cholesterol numbers  Not sexually active, declines screening.  No significant other    Separate  significant issues discussed: Hypertension -  Coordinate with cardiology on medicaiton concerns.  Reviewed recent cardiology consult notes.  At that time BP was at goal, but she notes some higher BP readings since adding latest BP medicaiton vs improved readings.   Recommendation:  Continue Chlorthalidone 25mg  daily   Continue Amlodipine 10mg  daily  Continue Atenolol 25mg  daily  Check BP at home 3 times per week.    Return in 3 weeks for nurse visit for BP check and drop off your BP readings.   Lymphedema - wearing compression hose.  Consider referral back to vascular  Obesity - consider weight management vs bariatric consult  Goiter - plan [place] now for 5 year f/u.   Prior US thyroid 10/2019,  IMPRESSION: 1. Multinodular goiter. 2. Dominant nodules have not significantly changed. Some of the nodules meet criteria for biopsy but these are stable since  05/20/2015. These nodules are likely benign based on the stability but consider another 1 year follow-up to ensure greater than 5 years of stability. 3. No new suspicious thyroid nodules.  LVH prior.   Echocardiogram 03/2020: Echocardiogram 03/15/2020:  Normal LV systolic function with EF 55%. Left ventricle cavity is normal  in size. Normal global wall motion. Doppler evidence of grade I (impaired)  diastolic dysfunction, normal LAP. Calculated EF 55%.  Left atrial cavity is mildly dilated at 3.9 cm. Atrial septum is  aneurysmal without obvious patent foramen ovale.  Iron deficiency anemia - currently being evaluated by GI, likely hematology consult soon.  Reviewed recent 05/2020 EGD and colonoscopy ,  CT abdomen pelvis  Estrogen deficiency and post menopausal - she will schedule bone density test  Please call to schedule your bone density test   [facility]  [phone] [address] [place], [place] [address]    Heel pain, bone spur, feet pain, plantar fascitis - advised night  time plantar fascia splints, follow up with orthopedics.    Heart disease risks - your atherosclerotic cardiovascular disease 10year risk > 7%.  Thus, I recommend you begin a cholesterol medicaiton such as Crestor daily at bedtime to lower heart disease risk.   Danie was seen today for annual exam.  Diagnoses and all orders for this visit:  Encounter for health maintenance examination in adult -     Cytology - PAP([place])  Need for influenza vaccination  Vaccine counseling  Screening for cervical cancer -     Cytology - PAP([place])  Class 3 severe obesity with serious comorbidity and body mass index (BMI) of 40.0 to 44.9 in adult, unspecified obesity type ([place])  Multinodular goiter -     US THYROID; Future  Lymphedema  LVH (left ventricular hypertrophy)  Iron deficiency anemia, unspecified iron deficiency anemia type  Heart murmur  Essential hypertension  Pain in both feet  Post-menopausal -     DG Bone Density; Future  Estrogen deficiency -     DG Bone Density; Future  Flat feet  Plantar fasciitis  Other orders -     Flu Vaccine QUAD 6+ mos PF IM (Fluarix Quad PF) -     cholecalciferol (VITAMIN D3) 25 MCG (1000 UNIT) tablet; Take 1 tablet (1,000 Units total) by mouth daily.     Follow-up pending bone density, thyroid US, yearly for physical

## 2020-08-09 NOTE — Telephone Encounter (Signed)
Genera - please call her about recommendations below, email copy of AV summary to her as there were several recommendations  Gina Lopez After reviewing your chart, I have several recommendations:  Recommendations:  Hypertensino  Continue Chlorthalidone 25mg  daily   Continue Amlodipine 10mg  daily  Continue Atenolol 25mg  daily  Check BP at home 3 times per week.    Return in 3 weeks for nurse visit for BP check and drop off your BP readings.   Lymphedema - see if she is interested in referral back to vein clinic for consult or referral to therapy regarding lymphdema  Obesity - I recommend referral to either weight management vs bariatric consult  Goiter - lets repeat thyroid ultrasound.  If stable this year, then she wont need ongoing ultrasounds per radiologist recommendation last year.  Iron deficiency anemia - follow up with gastro as planned  Please call to schedule your bone density test to screen for osteoporosis  [facility]  [phone] [address] [place], [place] [address]    Heel pain, bone spur, feet pain, plantar fascitis - try OTC night time plantar fascia splints, follow up with orthopedics.      Heart disease risks - your atherosclerotic cardiovascular disease 10year risk > 7%.  Thus, I recommend you begin a cholesterol medicaiton such as Crestor daily at bedtime to lower heart disease risk.  Follow up in 3 weeks with nurse visit for BP check and drop off BP readings.

## 2020-08-09 NOTE — Patient Instructions (Addendum)
Today you had a preventative care visit or wellness visit.    Topics today may have included healthy lifestyle, diet, exercise, preventative care, vaccinations, sick and well care, proper use of emergency dept and after hours care, as well as other concerns.     Recommendations: Continue to return yearly for your annual wellness and preventative care visits.  This gives [place] a chance to discuss healthy lifestyle, exercise, vaccinations, review your chart record, and perform screenings where appropriate.  I recommend you see your eye doctor yearly for routine vision care.  I recommend you see your dentist yearly for routine dental care including hygiene visits twice yearly.   Vaccination recommendations were reviewed You report being up to date on covid   Shingles vaccine:  I recommend you have a shingles vaccine to help prevent shingles or herpes zoster outbreak.   Please call your insurer to inquire about coverage for the Shingrix vaccine given in 2 doses.   Some insurers cover this vaccine after age 45, some cover this after age 80.  If your insurer covers this, then call to schedule appointment to have this vaccine here.  Tetanus vaccine is up to date  We recommend a yearly flu shot in the fall  Pneumococcal vaccine will be due at age 89yo    Screening for cancer: Breast cancer screening: You should perform a self breast exam monthly.   We reviewed recommendations for regular mammograms and breast cancer screening.  Colon cancer screening:  I reviewed your colonoscopy on file that is up to date from 05/2020, repeat in 5-7 years  Cervical cancer screening: We reviewed recommendations for pap smear screening.  Skin cancer screening: Check your skin regularly for new changes, growing lesions, or other lesions of concern Come in for evaluation if you have skin lesions of concern.  Lung cancer screening: If you have a greater than 30 pack year history of tobacco use, then you  qualify for lung cancer screening with a chest CT scan  We currently don't have screenings for other cancers besides breast, cervical, colon, and lung cancers.  If you have a strong family history of cancer or have other cancer screening concerns, please let me know.    Bone health: Get at least 150 minutes of aerobic exercise weekly Get weight bearing exercise at least once weekly Call insurance to check coverage for bone density screening, diagnosis -  Post menopausal estrogen deficiency  Heart health: Get at least 150 minutes of aerobic exercise weekly Limit alcohol It is important to maintain a healthy blood pressure and healthy cholesterol numbers  Not sexually active, declines screening.  No significant other    Separate significant issues discussed: Hypertension -  Coordinate with cardiology on medicaiton concerns.  Reviewed recent cardiology consult notes.  At that time BP was at goal, but she notes some higher BP readings since adding latest BP medicaiton vs improved readings.   Recommendation:  Continue Chlorthalidone 25mg  daily   Continue Amlodipine 10mg  daily  Continue Atenolol 25mg  daily  Check BP at home 3 times per week.    Return in 3 weeks for nurse visit for BP check and drop off your BP readings.    Lymphedema - wearing compression hose.  Consider referral back to vascular  Obesity - consider weight management vs bariatric consult  Goiter - plan [place] now for 5 year f/u.   Prior US thyroid 10/2019,  IMPRESSION: 1. Multinodular goiter. 2. Dominant nodules have not significantly changed. Some of the nodules  meet criteria for biopsy but these are stable since 05/20/2015. These nodules are likely benign based on the stability but consider another 1 year follow-up to ensure greater than 5 years of stability. 3. No new suspicious thyroid nodules.  LVH prior.   Echocardiogram 03/2020: Echocardiogram 03/15/2020:  Normal LV systolic function with EF 55%. Left  ventricle cavity is normal  in size. Normal global wall motion. Doppler evidence of grade I (impaired)  diastolic dysfunction, normal LAP. Calculated EF 55%.  Left atrial cavity is mildly dilated at 3.9 cm. Atrial septum is  aneurysmal without obvious patent foramen ovale.  Iron deficiency anemia - currently being evaluated by GI, likely hematology consult soon.  Reviewed recent 05/2020 EGD and colonoscopy ,  CT abdomen pelvis  Estrogen deficiency and post menopausal - she will schedule bone density test  Please call to schedule your bone density test   [facility]  [phone] [address], [place], [place] [address]    Heel pain, bone spur, feet pain, plantar fascitis - advised night time plantar fascia splints, follow up with orthopedics.      Heart disease risks - your atherosclerotic cardiovascular disease 10year risk > 7%.  Thus, I recommend you begin a cholesterol medicaiton such as Crestor daily at bedtime to lower heart disease risk.

## 2020-08-10 ENCOUNTER — Telehealth: Payer: Self-pay | Admitting: Medical

## 2020-08-10 ENCOUNTER — Telehealth: Payer: Self-pay | Admitting: Gastroenterology

## 2020-08-10 NOTE — Telephone Encounter (Signed)
Patient has been informed of recommendations.

## 2020-08-10 NOTE — Telephone Encounter (Signed)
I didn't get a reply back on where you called Ms. Weppler.  Your note says she has been informed of recommendations.   I know this was a long note ,but there was suppose to be reply back on several issues.  So what did she agree to and not agree to out of my list of my recommendations, referrals, medications?

## 2020-08-10 NOTE — Telephone Encounter (Signed)
Patient returning nurse's call in regards to her results.

## 2020-08-10 NOTE — Telephone Encounter (Signed)
Spoke with patient, see 08/05/20 CT enterography results for more information.

## 2020-08-11 LAB — CYTOLOGY - PAP
Comment: NEGATIVE
Diagnosis: NEGATIVE
High risk HPV: NEGATIVE

## 2020-08-11 NOTE — Telephone Encounter (Signed)
Unable to reach patient by phone. Sent patient the message on mychart in detail. Awaiting patient response.

## 2020-08-11 NOTE — Telephone Encounter (Signed)
Patient has been informed of recommendations and would like to decline all at this time. She would like to focus on getting her foot better as she works 12 hours shifts. She is not sure exactly when she can come back in for 3 week follow up so she will call back when she is ready to schedule.

## 2020-08-18 ENCOUNTER — Ambulatory Visit: Payer: 59

## 2020-08-19 ENCOUNTER — Telehealth: Payer: Self-pay | Admitting: Medical

## 2020-08-19 NOTE — Telephone Encounter (Signed)
Requested records received from [facility]

## 2020-08-21 ENCOUNTER — Other Ambulatory Visit: Payer: Self-pay | Admitting: Medical

## 2020-08-24 ENCOUNTER — Ambulatory Visit
Admission: RE | Admit: 2020-08-24 | Discharge: 2020-08-24 | Disposition: A | Discharge status: Home / Self Care | Payer: 59 | Source: Ambulatory Visit | Attending: Medical | Admitting: Medical

## 2020-08-24 DIAGNOSIS — E042 Nontoxic multinodular goiter: Secondary | ICD-10-CM

## 2020-09-07 ENCOUNTER — Other Ambulatory Visit: Payer: Self-pay | Admitting: Cardiology

## 2020-09-07 DIAGNOSIS — I10 Essential (primary) hypertension: Secondary | ICD-10-CM

## 2020-10-11 ENCOUNTER — Other Ambulatory Visit: Payer: Self-pay | Admitting: Cardiology

## 2020-10-11 ENCOUNTER — Ambulatory Visit
Admission: RE | Admit: 2020-10-11 | Discharge: 2020-10-11 | Disposition: A | Discharge status: Home / Self Care | Payer: 59 | Source: Ambulatory Visit | Attending: Medical | Admitting: Medical

## 2020-10-11 ENCOUNTER — Other Ambulatory Visit: Payer: Self-pay

## 2020-10-11 DIAGNOSIS — I10 Essential (primary) hypertension: Secondary | ICD-10-CM

## 2020-10-11 DIAGNOSIS — Z1231 Encounter for screening mammogram for malignant neoplasm of breast: Secondary | ICD-10-CM

## 2020-10-17 ENCOUNTER — Other Ambulatory Visit: Payer: Self-pay

## 2020-10-17 DIAGNOSIS — I10 Essential (primary) hypertension: Secondary | ICD-10-CM

## 2020-10-17 MED ORDER — CHLORTHALIDONE 25 MG PO TABS: ORAL_TABLET | ORAL | 0 refills | Status: DC

## 2020-12-10 ENCOUNTER — Other Ambulatory Visit: Payer: Self-pay | Admitting: Cardiology

## 2020-12-10 DIAGNOSIS — I10 Essential (primary) hypertension: Secondary | ICD-10-CM

## 2021-01-17 ENCOUNTER — Telehealth: Payer: Self-pay

## 2021-01-17 NOTE — Telephone Encounter (Signed)
error 

## 2021-01-17 NOTE — Telephone Encounter (Signed)
Pt. Called stating she wanted to know if she could get a refill on her atenolol, chlorthaldone, amlodipine, and iron tablet ferrex. She needs a 30 day supply per ins. With refills so she doesn't have to keep calling for refills every month. She stated some of these medications are filled by cardiology but she has been having issues with them filling them for only a 30 day supply with no refills. She wanted to know if you could fill these for her. To [facility] on [address]

## 2021-01-18 ENCOUNTER — Other Ambulatory Visit: Payer: Self-pay

## 2021-01-18 DIAGNOSIS — I10 Essential (primary) hypertension: Secondary | ICD-10-CM

## 2021-01-18 MED ORDER — POLYSACCHARIDE IRON COMPLEX 150 MG PO CAPS: 150.0000 mg | ORAL_CAPSULE | Freq: Every day | ORAL | 5 refills | Status: DC

## 2021-01-18 MED ORDER — CHLORTHALIDONE 25 MG PO TABS: ORAL_TABLET | ORAL | 5 refills | Status: DC

## 2021-01-18 MED ORDER — ATENOLOL 25 MG PO TABS: 25.0000 mg | ORAL_TABLET | Freq: Every day | ORAL | 5 refills | Status: DC

## 2021-01-18 MED ORDER — AMLODIPINE BESYLATE 10 MG PO TABS: 10.0000 mg | ORAL_TABLET | Freq: Every day | ORAL | 5 refills | Status: DC

## 2021-03-11 ENCOUNTER — Emergency Department (HOSPITAL_COMMUNITY)
Admission: EM | Admit: 2021-03-11 | Discharge: 2021-03-11 | Disposition: A | Discharge status: Home / Self Care | Payer: 59 | Attending: Emergency Medicine | Admitting: Emergency Medicine

## 2021-03-11 ENCOUNTER — Other Ambulatory Visit: Payer: Self-pay

## 2021-03-11 ENCOUNTER — Emergency Department (HOSPITAL_COMMUNITY): Payer: 59

## 2021-03-11 ENCOUNTER — Encounter (HOSPITAL_COMMUNITY): Payer: Self-pay | Admitting: Emergency Medicine

## 2021-03-11 DIAGNOSIS — M1712 Unilateral primary osteoarthritis, left knee: Secondary | ICD-10-CM

## 2021-03-11 DIAGNOSIS — I10 Essential (primary) hypertension: Secondary | ICD-10-CM | POA: Insufficient documentation

## 2021-03-11 DIAGNOSIS — M25562 Pain in left knee: Secondary | ICD-10-CM | POA: Diagnosis not present

## 2021-03-11 DIAGNOSIS — Z87891 Personal history of nicotine dependence: Secondary | ICD-10-CM | POA: Diagnosis not present

## 2021-03-11 DIAGNOSIS — Z79899 Other long term (current) drug therapy: Secondary | ICD-10-CM | POA: Diagnosis not present

## 2021-03-11 MED ORDER — IBUPROFEN 400 MG PO TABS
400.0000 mg | ORAL_TABLET | Freq: Once | ORAL | Status: AC
  Administered 2021-03-11: 400 mg via ORAL
  Filled 2021-03-11: qty 1

## 2021-03-11 MED ORDER — ACETAMINOPHEN 500 MG PO TABS
1000.0000 mg | ORAL_TABLET | Freq: Once | ORAL | Status: AC
  Administered 2021-03-11: 1000 mg via ORAL
  Filled 2021-03-11: qty 2

## 2021-03-11 NOTE — ED Triage Notes (Signed)
Pt c/o left knee pain that started today. Denies injury/fall. States she walks up and down stairs daily at work, hx lymphedema.

## 2021-03-11 NOTE — Discharge Instructions (Addendum)
It was our pleasure to provide your ER care today - we hope that you feel better.  Your xrays show no bony injury or fracture - note is made of arthritis in knee.   Take acetaminophen and/or ibuprofen as need for pain.  Follow up with primary care doctor in the next couple weeks.   Return to ER if worse, new symptoms, fevers, severe swelling, numbness/weakness, or other concern.

## 2021-03-11 NOTE — ED Provider Notes (Signed)
[facility] EMERGENCY DEPARTMENT Provider Note   CSN: [id number] Arrival date & time: 03/11/21  1740     History Chief Complaint  Patient presents with   Knee Pain    Gina Lopez is a 65 y.o. female.  Pt c/o left knee pain for the past couple of days. Symptoms acute onset, dull, moderate, left knee anteriorly/diffusely, non radiating, worse w certain movements/walking. Denies thigh or lower leg pain. No associated numbness/weakness. No trauma or twisting injury. ?hx arthritis. No hip, ankle or other joint pain. No fever or chills. No increased swelling to knee or leg. No skin changes, rash, lesions or erythema.   The history is provided by the patient and medical records.  Knee Pain Associated symptoms: no back pain and no fever       Past Medical History:  Diagnosis Date   Allergy    environmental in spring   Anemia    Arthritis    knees   Colon polyp 07/2015   tubular adenoma, Dr. Havery Moros   Heart murmur    benign, 2013 echo   History of echocardiogram 02/22/12   mild concentric LVH, EF 55%, left atrial mildly dilated, Dr. Debara Pickett   Hypertension    Lymphedema of leg    L>R, sees vein clinic, uses compression pump device at home daily; [facility]in [facility]   Obesity    Wears partial dentures    upper only    Patient Active Problem List   Diagnosis Date Noted   Need for influenza vaccination 08/09/2020   Pain in both feet 08/09/2020   Post-menopausal 08/09/2020   Estrogen deficiency 08/09/2020   Flat feet 08/09/2020   Plantar fasciitis 08/09/2020   Multinodular goiter 01/26/2020   Alkaline phosphatase elevation 09/04/2019   Allergic rhinitis due to pollen 09/04/2019   Leg cramp 09/04/2019   Digital mucinous cyst of finger of right hand 05/28/2017   Vaccine counseling 05/28/2017   Thyroid nodule 05/28/2017   Iron deficiency anemia 03/27/2016   Lymphedema 05/18/2015   Encounter for health maintenance examination in adult 05/18/2015   Obesity  05/18/2015   Special screening for malignant neoplasms, colon 05/18/2015   Screening for diabetes mellitus 05/18/2015   Screening for cervical cancer 05/18/2015   Need for prophylactic vaccination and inoculation against influenza 05/18/2015   Essential hypertension 04/29/2015   LVH (left ventricular hypertrophy) 04/29/2015   Heart murmur 01/09/2012    Past Surgical History:  Procedure Laterality Date   COLONOSCOPY     age 54yo due to blood in stool,    COLONOSCOPY  07/2015   tubular adenoma polyp, repeat 5 years, Dr. Havery Moros   CYST EXCISION  01/14/15   finger   FINGER SURGERY     reattachment of tip of pinky finger on left hand   POLYPECTOMY     VEIN LIGATION AND STRIPPING     [facility]     OB History   No obstetric history on file.     Family History  Problem Relation Age of Onset   Arthritis Mother    Hypertension Mother    Obesity Mother    Hip fracture Mother    Kidney disease Mother    Alcohol abuse Father    Diabetes Brother    Cancer Sister        type unknown   Heart disease Neg Hx    Stroke Neg Hx    Colon cancer Neg Hx    Stomach cancer Neg Hx  Rectal cancer Neg Hx    Colon polyps Neg Hx     Social History   Tobacco Use   Smoking status: Former    Packs/day: 0.25    Years: 1.00    Pack years: 0.25    Types: Cigarettes    Quit date: 5    Years since quitting: 39.7   Smokeless tobacco: Never  Vaping Use   Vaping Use: Never used  Substance Use Topics   Alcohol use: Yes    Comment: rare   Drug use: No    Home Medications Prior to Admission medications   Medication Sig Start Date End Date Taking? Authorizing Provider  amLODipine (NORVASC) 10 MG tablet Take 1 tablet (10 mg total) by mouth daily. 01/18/21   Tysinger, Camelia Eng, PA-C  atenolol (TENORMIN) 25 MG tablet Take 1 tablet (25 mg total) by mouth daily. 01/18/21   Tysinger, Camelia Eng, PA-C  chlorthalidone (HYGROTON) 25 MG tablet TAKE 1 TABLET BY MOUTH ONCE DAILY  . 01/18/21   Tysinger, Camelia Eng, PA-C  cholecalciferol (VITAMIN D3) 25 MCG (1000 UNIT) tablet Take 1 tablet (1,000 Units total) by mouth daily. 08/09/20   Tysinger, Camelia Eng, PA-C  iron polysaccharides (FERREX 150) 150 MG capsule Take 1 capsule (150 mg total) by mouth daily. 01/18/21   Tysinger, Camelia Eng, PA-C  omeprazole (PRILOSEC) 20 MG capsule Take 1 capsule (20 mg total) by mouth daily. 05/25/20   Armbruster, Carlota Raspberry, MD    Allergies    Codeine and Terbinafine and related  Review of Systems   Review of Systems  Constitutional:  Negative for chills and fever.  Respiratory:  Negative for shortness of breath.   Cardiovascular:  Negative for chest pain.  Musculoskeletal:  Negative for back pain.  Skin:  Negative for rash and wound.  Neurological:  Negative for weakness and numbness.  Hematological:  Does not bruise/bleed easily.   Physical Exam Updated Vital Signs BP (!) 144/74 (BP Location: Left Arm)   Pulse 94   Temp 98.6 F (37 C) (Oral)   Resp 18   SpO2 100%   Physical Exam Vitals and nursing note reviewed.  Constitutional:      Appearance: Normal appearance. She is well-developed.  HENT:     Head: Atraumatic.     Nose: Nose normal.     Mouth/Throat:     Mouth: Mucous membranes are moist.  Eyes:     General: No scleral icterus.    Conjunctiva/sclera: Conjunctivae normal.  Neck:     Trachea: No tracheal deviation.  Cardiovascular:     Rate and Rhythm: Normal rate.     Pulses: Normal pulses.  Pulmonary:     Effort: Pulmonary effort is normal. No respiratory distress.  Genitourinary:    Comments: No cva tenderness.  Musculoskeletal:        General: No swelling.     Cervical back: Neck supple. No muscular tenderness.     Comments: Mild tenderness left knee anteriorly. Knee is grossly stable. No effusion. No erythema or increased warmth. No swelling to leg. No pain w passive rom. Distal pulses palp. No pain w rom at hip or ankle.   Skin:    General: Skin is warm and  dry.     Findings: No rash.  Neurological:     Mental Status: She is alert.     Comments: Alert, speech normal. LLE nvi w intact motor/sens fxn.   Psychiatric:        Mood and Affect: Mood  normal.    ED Results / Procedures / Treatments   Labs (all labs ordered are listed, but only abnormal results are displayed) Labs Reviewed - No data to display  EKG None  Radiology DG Knee Complete 4 Views Left  Result Date: 03/11/2021 CLINICAL DATA:  Pain. EXAM: LEFT KNEE - COMPLETE 4+ VIEW COMPARISON:  Left knee x-ray 07/25/2019. FINDINGS: There is no acute fracture or dislocation identified. There is moderate medial compartment joint space narrowing and osteophyte formation which has progressed compared to the prior exam. Lateral compartment osteophytes and patellofemoral osteophytes are stable. Soft tissues are within normal limits. IMPRESSION: 1. No acute bony abnormality. 2. Tricompartmental osteoarthrosis, progressed in the medial compartment. Electronically Signed   By: Ronney Asters M.D.   On: 03/11/2021 18:55    Procedures Procedures   Medications Ordered in ED Medications  acetaminophen (TYLENOL) tablet 1,000 mg (has no administration in time range)  ibuprofen (ADVIL) tablet 400 mg (has no administration in time range)    ED Course  I have reviewed the triage vital signs and the nursing notes.  Pertinent labs & imaging results that were available during my care of the patient were reviewed by me and considered in my medical decision making (see chart for details).    MDM Rules/Calculators/A&P                          Xrays.   Reviewed nursing notes and prior charts for additional history.   Xrays reviewed/interpreted by me - no fx/acute process.   Discussed xrays w pt.   Acetaminophen po, ibuprofen po.   Pt appears stable for d/c.    Final Clinical Impression(s) / ED Diagnoses Final diagnoses:  None    Rx / DC Orders ED Discharge Orders     None         Lajean Saver, MD 03/11/21 [phone]

## 2021-03-15 ENCOUNTER — Telehealth: Payer: Self-pay | Admitting: Medical

## 2021-03-15 NOTE — Telephone Encounter (Signed)
Pt was called concerning her recent er visit. Pt states she has made an appt with ortho and will be followin gup with them.

## 2021-07-19 ENCOUNTER — Other Ambulatory Visit: Payer: Self-pay | Admitting: Medical

## 2021-07-19 DIAGNOSIS — I10 Essential (primary) hypertension: Secondary | ICD-10-CM

## 2021-08-10 ENCOUNTER — Encounter: Payer: 59 | Admitting: Medical

## 2021-08-10 ENCOUNTER — Telehealth: Payer: Self-pay | Admitting: Medical

## 2021-08-10 DIAGNOSIS — R7301 Impaired fasting glucose: Secondary | ICD-10-CM | POA: Insufficient documentation

## 2021-08-10 DIAGNOSIS — E559 Vitamin D deficiency, unspecified: Secondary | ICD-10-CM | POA: Insufficient documentation

## 2021-08-10 NOTE — Progress Notes (Deleted)
Shingrix ?Pneumo? ?Colon 21 ?Mammo UTD5/22 ?3/22 pap, hpv neg, pap neg ?1/22 low iron, anemia ?Vit d def ?Impaired glucose ? ?

## 2021-08-10 NOTE — Telephone Encounter (Signed)
This patient no showed for their appointment today.Which of the following is necessary for this patient.  ? ?A) No follow-up necessary  ? ?B) Follow-up urgent. Locate Patient Immediately.  ? ?C) Follow-up necessary. Contact patient and Schedule visit in ____ Days.  ? ?D) Follow-up Advised. Contact patient and Schedule visit in ____ Days.  ? ?E) Please Send no show letter to patient. Charge no show fee if no show was a CPE.   ? ?Also call, I dont recall her no showing like this  ?

## 2021-08-11 ENCOUNTER — Telehealth: Payer: Self-pay

## 2021-08-11 NOTE — Telephone Encounter (Signed)
Called pt Left message on machine

## 2021-08-11 NOTE — Telephone Encounter (Signed)
Pt called back stating she didn't remember scheduling the appt from last year.  She rescheduled

## 2021-08-18 ENCOUNTER — Encounter: Payer: Self-pay | Admitting: Medical

## 2021-08-21 ENCOUNTER — Other Ambulatory Visit: Payer: Self-pay | Admitting: Medical

## 2021-08-21 DIAGNOSIS — I10 Essential (primary) hypertension: Secondary | ICD-10-CM

## 2021-09-18 ENCOUNTER — Other Ambulatory Visit: Payer: Self-pay | Admitting: Medical

## 2021-10-27 ENCOUNTER — Ambulatory Visit: Payer: 59 | Admitting: Medical

## 2021-10-27 ENCOUNTER — Encounter: Payer: Self-pay | Admitting: Medical

## 2021-10-27 VITALS — BP 124/80 | HR 66 | Temp 98.2°F | Ht 68.0 in | Wt 300.0 lb

## 2021-10-27 DIAGNOSIS — Z1231 Encounter for screening mammogram for malignant neoplasm of breast: Secondary | ICD-10-CM

## 2021-10-27 DIAGNOSIS — E559 Vitamin D deficiency, unspecified: Secondary | ICD-10-CM

## 2021-10-27 DIAGNOSIS — Z6841 Body Mass Index (BMI) 40.0 and over, adult: Secondary | ICD-10-CM

## 2021-10-27 DIAGNOSIS — K760 Fatty (change of) liver, not elsewhere classified: Secondary | ICD-10-CM | POA: Insufficient documentation

## 2021-10-27 DIAGNOSIS — I890 Lymphedema, not elsewhere classified: Secondary | ICD-10-CM | POA: Diagnosis not present

## 2021-10-27 DIAGNOSIS — Z7185 Encounter for immunization safety counseling: Secondary | ICD-10-CM

## 2021-10-27 DIAGNOSIS — Z780 Asymptomatic menopausal state: Secondary | ICD-10-CM | POA: Insufficient documentation

## 2021-10-27 DIAGNOSIS — R7301 Impaired fasting glucose: Secondary | ICD-10-CM

## 2021-10-27 DIAGNOSIS — Z131 Encounter for screening for diabetes mellitus: Secondary | ICD-10-CM

## 2021-10-27 DIAGNOSIS — Z0000 Encounter for general adult medical examination without abnormal findings: Secondary | ICD-10-CM | POA: Diagnosis not present

## 2021-10-27 DIAGNOSIS — E042 Nontoxic multinodular goiter: Secondary | ICD-10-CM | POA: Diagnosis not present

## 2021-10-27 DIAGNOSIS — R911 Solitary pulmonary nodule: Secondary | ICD-10-CM | POA: Insufficient documentation

## 2021-10-27 DIAGNOSIS — E66813 Obesity, class 3: Secondary | ICD-10-CM

## 2021-10-27 DIAGNOSIS — Z13220 Encounter for screening for lipoid disorders: Secondary | ICD-10-CM | POA: Insufficient documentation

## 2021-10-27 DIAGNOSIS — R748 Abnormal levels of other serum enzymes: Secondary | ICD-10-CM

## 2021-10-27 DIAGNOSIS — I10 Essential (primary) hypertension: Secondary | ICD-10-CM

## 2021-10-27 DIAGNOSIS — D509 Iron deficiency anemia, unspecified: Secondary | ICD-10-CM

## 2021-10-27 MED ORDER — CHLORTHALIDONE 25 MG PO TABS: 25.0000 mg | ORAL_TABLET | Freq: Every day | ORAL | 3 refills | Status: DC

## 2021-10-27 MED ORDER — AMLODIPINE BESYLATE 10 MG PO TABS: 10.0000 mg | ORAL_TABLET | Freq: Every day | ORAL | 3 refills | Status: DC

## 2021-10-27 MED ORDER — ATENOLOL 25 MG PO TABS: 25.0000 mg | ORAL_TABLET | Freq: Every day | ORAL | 3 refills | Status: DC

## 2021-10-27 NOTE — Assessment & Plan Note (Signed)
Continue compression hose, referral to vascular surgery for updated consult.  Advise she use her home therapy machine

## 2021-10-27 NOTE — Progress Notes (Signed)
Subjective:   HPI  Gina Lopez is a 66 y.o. female who presents for Chief Complaint  Patient presents with   Annual Exam    CPE fasting labs lt. Leg swelling and pain, goes to eye doctor yearly and dentist yearly. No other doctor's.     Patient Care Team: Chrysten Woulfe, Leward Quan as PCP - General (Family Medicine) Sees dentist Sees eye doctor Dr. Ingram Cellar, GI Dr. Vernell Leep, cardiology Dr. Ila Mcgill, podiatry [facility] Dr. Merri Ray, vascular  Concerns: Hypertension-compliant with medications.  No particular issues.  No chest pain or shortness of breath.  Her main concern is lymphedema.  She has a device at home for mechanical therapy but has not been using it.  When she does use it is for an hour a day but she notes every time she uses it she ends up falling asleep  Past Medical History:  Diagnosis Date   Allergy    environmental in spring   Anemia    Arthritis    knees   Colon polyp 07/2015   tubular adenoma, Dr. Havery Moros   Heart murmur    benign, 2013 echo   History of echocardiogram 02/22/12   mild concentric LVH, EF 55%, left atrial mildly dilated, Dr. Debara Pickett   Hypertension    Lymphedema of leg    L>R, sees vein clinic, uses compression pump device at home daily; [facility]in Clinic   Obesity    Wears partial dentures    upper only    Family History  Problem Relation Age of Onset   Arthritis Mother    Hypertension Mother    Obesity Mother    Hip fracture Mother    Kidney disease Mother    Alcohol abuse Father    Diabetes Brother    Cancer Sister        type unknown   Heart disease Neg Hx    Stroke Neg Hx    Colon cancer Neg Hx    Stomach cancer Neg Hx    Rectal cancer Neg Hx    Colon polyps Neg Hx      Current Outpatient Medications:    cholecalciferol (VITAMIN D3) 25 MCG (1000 UNIT) tablet, Take 1 tablet (1,000 Units total) by mouth daily., Disp: 90 tablet, Rfl: 3   FERREX 150 150 MG capsule, Take 1 capsule by mouth once  daily, Disp: 30 capsule, Rfl: 0   vitamin B-12 (CYANOCOBALAMIN) 100 MCG tablet, Take 100 mcg by mouth daily., Disp: , Rfl:    amLODipine (NORVASC) 10 MG tablet, Take 1 tablet (10 mg total) by mouth daily., Disp: 90 tablet, Rfl: 3   atenolol (TENORMIN) 25 MG tablet, Take 1 tablet (25 mg total) by mouth daily., Disp: 90 tablet, Rfl: 3   chlorthalidone (HYGROTON) 25 MG tablet, Take 1 tablet (25 mg total) by mouth daily., Disp: 90 tablet, Rfl: 3   omeprazole (PRILOSEC) 20 MG capsule, Take 1 capsule (20 mg total) by mouth daily., Disp: 90 capsule, Rfl: 3  Allergies  Allergen Reactions   Codeine Other (See Comments)    Pt states it makes her feel dizzy and stay in her system forever   Terbinafine And Related Hives and Rash    Reviewed their medical, surgical, family, social, medication, and allergy history and updated chart as appropriate.  Review of Systems  Constitutional:  Negative for chills, fever, malaise/fatigue and weight loss.  HENT:  Negative for congestion, ear pain, hearing loss, sore throat and tinnitus.   Eyes:  Negative for blurred vision, pain and redness.  Respiratory:  Negative for cough, hemoptysis and shortness of breath.   Cardiovascular:  Positive for leg swelling. Negative for chest pain, palpitations, orthopnea and claudication.  Gastrointestinal:  Negative for abdominal pain, blood in stool, constipation, diarrhea, nausea and vomiting.  Genitourinary:  Negative for dysuria, flank pain, frequency, hematuria and urgency.  Musculoskeletal:  Negative for falls, joint pain and myalgias.  Skin:  Negative for itching and rash.  Neurological:  Negative for dizziness, tingling, speech change, weakness and headaches.  Endo/Heme/Allergies:  Negative for polydipsia. Does not bruise/bleed easily.  Psychiatric/Behavioral:  Negative for depression and memory loss. The patient is not nervous/anxious and does not have insomnia.        Objective:  BP 124/80   Pulse 66   Temp 98.2  F (36.8 C)   Ht '5\' 8"'$  (1.727 m)   Wt 300 lb (136.1 kg)   BMI 45.61 kg/m   BP Readings from Last 3 Encounters:  10/27/21 124/80  03/11/21 (!) 144/74  08/09/20 (!) 146/80   Wt Readings from Last 3 Encounters:  10/27/21 300 lb (136.1 kg)  08/09/20 283 lb 9.6 oz (128.6 kg)  07/27/20 282 lb (127.9 kg)    General appearance: alert, no distress, WD/WN, African American female Skin: unremarkable HEENT: normocephalic, conjunctiva/corneas normal, sclerae anicteric, PERRLA, EOMi Neck: supple, no lymphadenopathy, no thyromegaly, no masses, normal ROM, no bruits Chest: non tender, normal shape and expansion Heart: RRR, normal S1, S2, no murmurs Lungs: CTA bilaterally, no wheezes, rhonchi, or rales Abdomen: +bs, soft, non tender, non distended, no masses, no hepatomegaly, no splenomegaly, no bruits Back: non tender, normal ROM, no scoliosis Musculoskeletal: flat feet bilat, tender left posterior calcaneus, otherwise upper extremities non tender, no obvious deformity, normal ROM throughout, lower extremities non tender, no obvious deformity, normal ROM throughout Extremities: generalized lymphedema of bilat LE, no cyanosis, no clubbing Pulses: 2+ symmetric, upper and lower extremities, normal cap refill Neurological: alert, oriented x 3, CN2-12 intact, strength normal upper extremities and lower extremities, sensation normal throughout, DTRs 2+ throughout, no cerebellar signs, gait normal Psychiatric: normal affect, behavior normal, pleasant  Breast/gyn/rectal - declined/deferred    Assessment and Plan :   Encounter Diagnoses  Name Primary?   Encounter for health maintenance examination in adult Yes   Lymphedema    Multinodular goiter    Vitamin D deficiency    Vaccine counseling    Encounter for screening mammogram for malignant neoplasm of breast    Postmenopausal estrogen deficiency    Screening for lipid disorders    Iron deficiency anemia, unspecified iron deficiency anemia type     Screening for diabetes mellitus    Pulmonary nodule    Essential hypertension    Alkaline phosphatase elevation    Impaired fasting blood sugar    Class 3 severe obesity with serious comorbidity and body mass index (BMI) of 40.0 to 44.9 in adult, unspecified obesity type (HCC)    Hepatic steatosis     Today you had a preventative care visit or wellness visit.    Topics today may have included healthy lifestyle, diet, exercise, preventative care, vaccinations, sick and well care, proper use of emergency dept and after hours care, as well as other concerns.     Recommendations: Continue to return yearly for your annual wellness and preventative care visits.  This gives [place] a chance to discuss healthy lifestyle, exercise, vaccinations, review your chart record, and perform screenings where appropriate.  I recommend you  see your eye doctor yearly for routine vision care.  I recommend you see your dentist yearly for routine dental care including hygiene visits twice yearly.   Vaccination recommendations were reviewed Immunization History  Administered Date(s) Administered   Hepatitis B 02/26/2018, 03/31/2018   Influenza Split 03/17/2012   Influenza,inj,Quad PF,6+ Mos 05/18/2015, 03/27/2016, 08/09/2020   PFIZER(Purple Top)SARS-COV-2 Vaccination 09/10/2019, 10/05/2019, 06/09/2020   Tdap 10/29/2013    Shingles vaccine:  I recommend you have a shingles vaccine to help prevent shingles or herpes zoster outbreak.   Please call your insurer to inquire about coverage for the Shingrix vaccine given in 2 doses.   Some insurers cover this vaccine after age 51, some cover this after age 50.  If your insurer covers this, then call to schedule appointment to have this vaccine here.    Screening for cancer: Breast cancer screening: You should perform a self breast exam monthly.   We reviewed recommendations for regular mammograms and breast cancer screening.  Colon cancer screening:  I  reviewed your colonoscopy on file that is up to date from 05/2020, repeat in 5-7 years  Cervical cancer screening: Pap updated 2022, normal, neg HPV  Skin cancer screening: Check your skin regularly for new changes, growing lesions, or other lesions of concern Come in for evaluation if you have skin lesions of concern.  Lung cancer screening: If you have a greater than 30 pack year history of tobacco use, then you qualify for lung cancer screening with a chest CT scan  We currently don't have screenings for other cancers besides breast, cervical, colon, and lung cancers.  If you have a strong family history of cancer or have other cancer screening concerns, please let me know.    Bone health: Get at least 150 minutes of aerobic exercise weekly Get weight bearing exercise at least once weekly Schedule baseline bone density test  Please call to schedule your mammogram and bone density.   [facility]  [phone] [address] [place], [place] [address]    Heart health: Get at least 150 minutes of aerobic exercise weekly Limit alcohol It is important to maintain a healthy blood pressure and healthy cholesterol numbers  Echocardiogram 03/15/2020:  Normal LV systolic function with EF 55%. Left ventricle cavity is normal  in size. Normal global wall motion. Doppler evidence of grade I (impaired)  diastolic dysfunction, normal LAP. Calculated EF 55%.  Left atrial cavity is mildly dilated at 3.9 cm. Atrial septum is  aneurysmal without obvious patent foramen ovale.  Consider CT coronary calcium test heart disease screen    Separate significant issues discussed: Heart disease risks - your atherosclerotic cardiovascular disease 10year risk > 7%.  Thus, I recommend you begin a cholesterol medicaiton such as Crestor daily at bedtime to lower heart disease risk.  She will consider but declines today.   Problem List Items Addressed This  Visit     Essential hypertension    Continue current medications.       Relevant Medications   amLODipine (NORVASC) 10 MG tablet   atenolol (TENORMIN) 25 MG tablet   chlorthalidone (HYGROTON) 25 MG tablet   Lymphedema    Continue compression hose, referral to vascular surgery for updated consult.  Advise she use her home therapy machine       Relevant Orders   Ambulatory referral to Vascular Surgery   Encounter for health maintenance examination in adult - Primary   Relevant Orders   DG  Bone Density   MM DIGITAL SCREENING BILATERAL   CBC with Differential/Platelet   Iron, TIBC and Ferritin Panel   Lipid panel   Comprehensive metabolic panel   VITAMIN D 25 Hydroxy (Vit-D Deficiency, Fractures)   Hemoglobin A1c   TSH   Obesity    Counseled on diet and exercise.  Consider medication or  referral to help with these efforts       Screening for diabetes mellitus   Relevant Orders   Hemoglobin A1c   Iron deficiency anemia    Consistent with iron therapy daily.  Updated labs today.       Relevant Medications   vitamin B-12 (CYANOCOBALAMIN) 100 MCG tablet   Other Relevant Orders   CBC with Differential/Platelet   Iron, TIBC and Ferritin Panel   Vaccine counseling   Alkaline phosphatase elevation    History of on prior labs.  Updated labs today.       Multinodular goiter    reviewed 3/22 thyroid US Nodule 11, inferior left thyroid, unchanged, 1.5 cm. This nodule has clear through transmission with internal cystic changes/spongiform changes and does not meet criteria for surveillance or biopsy.   No adenopathy   IMPRESSION: Multinodular thyroid again demonstrated, with no thyroid nodule meeting criteria for further surveillance or biopsy, as above, designated by the newly established ACR TI-RADS criteria.       Relevant Medications   atenolol (TENORMIN) 25 MG tablet   Other Relevant Orders   TSH   Vitamin D deficiency   Relevant Orders   VITAMIN D 25  Hydroxy (Vit-D Deficiency, Fractures)   Impaired fasting blood sugar    Updated labs today       Encounter for screening mammogram for malignant neoplasm of breast   Relevant Orders   MM DIGITAL SCREENING BILATERAL   Postmenopausal estrogen deficiency   Relevant Orders   DG Bone Density   Screening for lipid disorders   Relevant Orders   Lipid panel   Pulmonary nodule     Pulmonary nodule on CT abdomen 2022.  She is low risk, non-smoker.  For now she declines any repeat imaging for surveillance.   radiology recommended if low risk not to repeat       Hepatic steatosis    Advised health low fat diet, weight loss        Follow-up yearly for physical

## 2021-10-27 NOTE — Assessment & Plan Note (Signed)
reviewed 3/22 thyroid US Nodule 11, inferior left thyroid, unchanged, 1.5 cm. This nodule has clear through transmission with internal cystic changes/spongiform changes and does not meet criteria for surveillance or biopsy.  No adenopathy  IMPRESSION: Multinodular thyroid again demonstrated, with no thyroid nodule meeting criteria for further surveillance or biopsy, as above, designated by the newly established ACR TI-RADS criteria.

## 2021-10-27 NOTE — Assessment & Plan Note (Signed)
Consistent with iron therapy daily.  Updated labs today.

## 2021-10-27 NOTE — Patient Instructions (Signed)
Today you had a preventative care visit or wellness visit.    Topics today may have included healthy lifestyle, diet, exercise, preventative care, vaccinations, sick and well care, proper use of emergency dept and after hours care, as well as other concerns.     Recommendations: Continue to return yearly for your annual wellness and preventative care visits.  This gives [place] a chance to discuss healthy lifestyle, exercise, vaccinations, review your chart record, and perform screenings where appropriate.  I recommend you see your eye doctor yearly for routine vision care.  I recommend you see your dentist yearly for routine dental care including hygiene visits twice yearly.   Vaccination recommendations were reviewed Immunization History  Administered Date(s) Administered   Hepatitis B 02/26/2018, 03/31/2018   Influenza Split 03/17/2012   Influenza,inj,Quad PF,6+ Mos 05/18/2015, 03/27/2016, 08/09/2020   PFIZER(Purple Top)SARS-COV-2 Vaccination 09/10/2019, 10/05/2019, 06/09/2020   Tdap 10/29/2013    Shingles vaccine:  I recommend you have a shingles vaccine to help prevent shingles or herpes zoster outbreak.   Please call your insurer to inquire about coverage for the Shingrix vaccine given in 2 doses.   Some insurers cover this vaccine after age 35, some cover this after age 44.  If your insurer covers this, then call to schedule appointment to have this vaccine here.    Screening for cancer: Breast cancer screening: You should perform a self breast exam monthly.   We reviewed recommendations for regular mammograms and breast cancer screening.  Colon cancer screening:  I reviewed your colonoscopy on file that is up to date from 05/2020, repeat in 5-7 years  Cervical cancer screening: Pap updated 2022, normal, neg HPV  Skin cancer screening: Check your skin regularly for new changes, growing lesions, or other lesions of concern Come in for evaluation if you have skin lesions of  concern.  Lung cancer screening: If you have a greater than 30 pack year history of tobacco use, then you qualify for lung cancer screening with a chest CT scan  We currently don't have screenings for other cancers besides breast, cervical, colon, and lung cancers.  If you have a strong family history of cancer or have other cancer screening concerns, please let me know.    Bone health: Get at least 150 minutes of aerobic exercise weekly Get weight bearing exercise at least once weekly Schedule baseline bone density test  Please call to schedule your mammogram and bone density.   [facility]  [phone] [address] [place], [place] [address]    Heart health: Get at least 150 minutes of aerobic exercise weekly Limit alcohol It is important to maintain a healthy blood pressure and healthy cholesterol numbers  Echocardiogram 03/15/2020:  Normal LV systolic function with EF 55%. Left ventricle cavity is normal  in size. Normal global wall motion. Doppler evidence of grade I (impaired)  diastolic dysfunction, normal LAP. Calculated EF 55%.  Left atrial cavity is mildly dilated at 3.9 cm. Atrial septum is  aneurysmal without obvious patent foramen ovale.  Consider CT coronary calcium test heart disase screen    Separate significant issues discussed: Hypertension -  continue current medication  Lymphedema - wearing compression hose.  Use your home therapy device.   I will place referral to vascular  Obesity - consider weight management vs bariatric consult  Goiter - reviewed 3/22 thyroid US Nodule 11, inferior left thyroid, unchanged, 1.5 cm. This nodule has clear through transmission with internal cystic changes/spongiform changes  and does not meet criteria for surveillance or biopsy.   No adenopathy   IMPRESSION: Multinodular thyroid again demonstrated, with no thyroid nodule meeting criteria for further surveillance or  biopsy, as above, designated by the newly established ACR TI-RADS criteria.   Iron deficiency anemia -  Reviewed recent 05/2020 EGD and colonoscopy ,  CT abdomen pelvis  Estrogen deficiency and post menopausal - she will schedule bone density test  Heart disease risks - your atherosclerotic cardiovascular disease 10year risk > 7%.  Thus, I recommend you begin a cholesterol medicaiton such as Crestor daily at bedtime to lower heart disease risk.

## 2021-10-27 NOTE — Assessment & Plan Note (Signed)
Continue current medications. 

## 2021-10-27 NOTE — Assessment & Plan Note (Signed)
History of on prior labs.  Updated labs today.

## 2021-10-27 NOTE — Assessment & Plan Note (Signed)
  Pulmonary nodule on CT abdomen 2022.  She is low risk, non-smoker.  For now she declines any repeat imaging for surveillance.   radiology recommended if low risk not to repeat

## 2021-10-27 NOTE — Assessment & Plan Note (Addendum)
Counseled on diet and exercise.  Consider medication or  referral to help with these efforts

## 2021-10-27 NOTE — Assessment & Plan Note (Signed)
Advised health low fat diet, weight loss

## 2021-10-27 NOTE — Assessment & Plan Note (Signed)
Updated labs today

## 2021-10-28 LAB — COMPREHENSIVE METABOLIC PANEL
ALT: 14 IU/L (ref 0–32)
AST: 15 IU/L (ref 0–40)
Albumin/Globulin Ratio: 1.5 (ref 1.2–2.2)
Albumin: 4.4 g/dL (ref 3.8–4.8)
Alkaline Phosphatase: 123 IU/L — ABNORMAL HIGH (ref 44–121)
BUN/Creatinine Ratio: 18 (ref 12–28)
BUN: 16 mg/dL (ref 8–27)
Bilirubin Total: 0.4 mg/dL (ref 0.0–1.2)
CO2: 25 mmol/L (ref 20–29)
Calcium: 9.8 mg/dL (ref 8.7–10.3)
Chloride: 101 mmol/L (ref 96–106)
Creatinine, Ser: 0.91 mg/dL (ref 0.57–1.00)
Globulin, Total: 2.9 g/dL (ref 1.5–4.5)
Glucose: 96 mg/dL (ref 70–99)
Potassium: 3.2 mmol/L — ABNORMAL LOW (ref 3.5–5.2)
Sodium: 144 mmol/L (ref 134–144)
Total Protein: 7.3 g/dL (ref 6.0–8.5)
eGFR: 70 mL/min/{1.73_m2} (ref >59)

## 2021-10-28 LAB — LIPID PANEL
Chol/HDL Ratio: 2.4 ratio (ref 0.0–4.4)
Cholesterol, Total: 159 mg/dL (ref 100–199)
HDL: 66 mg/dL (ref >39)
LDL Chol Calc (NIH): 80 mg/dL (ref 0–99)
Triglycerides: 65 mg/dL (ref 0–149)
VLDL Cholesterol Cal: 13 mg/dL (ref 5–40)

## 2021-10-28 LAB — VITAMIN D 25 HYDROXY (VIT D DEFICIENCY, FRACTURES): Vit D, 25-Hydroxy: 30.7 ng/mL (ref 30.0–100.0)

## 2021-10-28 LAB — HEMOGLOBIN A1C
Est. average glucose Bld gHb Est-mCnc: 140 mg/dL
Hgb A1c MFr Bld: 6.5 % — ABNORMAL HIGH (ref 4.8–5.6)

## 2021-10-28 LAB — CBC WITH DIFFERENTIAL/PLATELET
Basophils Absolute: 0.1 10*3/uL (ref 0.0–0.2)
Basos: 1 %
EOS (ABSOLUTE): 0.3 10*3/uL (ref 0.0–0.4)
Eos: 4 %
Hematocrit: 34.6 % (ref 34.0–46.6)
Hemoglobin: 10.8 g/dL — ABNORMAL LOW (ref 11.1–15.9)
Immature Grans (Abs): 0 10*3/uL (ref 0.0–0.1)
Immature Granulocytes: 0 %
Lymphocytes Absolute: 2.3 10*3/uL (ref 0.7–3.1)
Lymphs: 27 %
MCH: 24.5 pg — ABNORMAL LOW (ref 26.6–33.0)
MCHC: 31.2 g/dL — ABNORMAL LOW (ref 31.5–35.7)
MCV: 79 fL (ref 79–97)
Monocytes Absolute: 0.6 10*3/uL (ref 0.1–0.9)
Monocytes: 7 %
Neutrophils Absolute: 5.2 10*3/uL (ref 1.4–7.0)
Neutrophils: 61 %
Platelets: 253 10*3/uL (ref 150–450)
RBC: 4.41 x10E6/uL (ref 3.77–5.28)
RDW: 14.5 % (ref 11.7–15.4)
WBC: 8.5 10*3/uL (ref 3.4–10.8)

## 2021-10-28 LAB — IRON,TIBC AND FERRITIN PANEL
Ferritin: 37 ng/mL (ref 15–150)
Iron Saturation: 9 % — CL (ref 15–55)
Iron: 33 ug/dL (ref 27–139)
Total Iron Binding Capacity: 374 ug/dL (ref 250–450)
UIBC: 341 ug/dL (ref 118–369)

## 2021-10-28 LAB — TSH: TSH: 1.55 u[IU]/mL (ref 0.450–4.500)

## 2021-10-30 ENCOUNTER — Other Ambulatory Visit: Payer: Self-pay | Admitting: Medical

## 2021-10-31 ENCOUNTER — Other Ambulatory Visit: Payer: Self-pay | Admitting: Medical

## 2021-10-31 DIAGNOSIS — I10 Essential (primary) hypertension: Secondary | ICD-10-CM

## 2021-10-31 MED ORDER — CHLORTHALIDONE 25 MG PO TABS: 12.5000 mg | ORAL_TABLET | Freq: Every day | ORAL | 3 refills | Status: DC

## 2021-10-31 MED ORDER — VITAMIN D 50 MCG (2000 UT) PO CAPS: 1.0000 | ORAL_CAPSULE | Freq: Every day | ORAL | 3 refills | Status: DC

## 2021-10-31 MED ORDER — POLYSACCHARIDE IRON COMPLEX 150 MG PO CAPS: 150.0000 mg | ORAL_CAPSULE | Freq: Two times a day (BID) | ORAL | 1 refills | Status: DC

## 2021-10-31 NOTE — Telephone Encounter (Signed)
Per her lab resutls from 10/27/21 you suggested she increase her iron from once daily to BID-can this be sent for BID? I was unsure, thanks.

## 2021-11-02 ENCOUNTER — Encounter: Payer: Self-pay | Admitting: Internal Medicine

## 2021-11-16 ENCOUNTER — Other Ambulatory Visit: Payer: Self-pay | Admitting: Medical

## 2021-11-16 ENCOUNTER — Ambulatory Visit
Admission: RE | Admit: 2021-11-16 | Discharge: 2021-11-16 | Disposition: A | Discharge status: Home / Self Care | Payer: 59 | Source: Ambulatory Visit | Attending: Medical | Admitting: Medical

## 2021-11-16 DIAGNOSIS — Z0000 Encounter for general adult medical examination without abnormal findings: Secondary | ICD-10-CM

## 2021-11-16 DIAGNOSIS — Z1231 Encounter for screening mammogram for malignant neoplasm of breast: Secondary | ICD-10-CM

## 2021-11-16 DIAGNOSIS — Z780 Asymptomatic menopausal state: Secondary | ICD-10-CM

## 2021-12-01 ENCOUNTER — Other Ambulatory Visit: Payer: Self-pay | Admitting: *Deleted

## 2021-12-01 DIAGNOSIS — I890 Lymphedema, not elsewhere classified: Secondary | ICD-10-CM

## 2021-12-04 ENCOUNTER — Ambulatory Visit: Payer: 59 | Admitting: Medical

## 2021-12-04 VITALS — BP 130/80 | HR 71 | Wt 306.8 lb

## 2021-12-04 DIAGNOSIS — I10 Essential (primary) hypertension: Secondary | ICD-10-CM | POA: Diagnosis not present

## 2021-12-04 DIAGNOSIS — E876 Hypokalemia: Secondary | ICD-10-CM | POA: Diagnosis not present

## 2021-12-04 DIAGNOSIS — D509 Iron deficiency anemia, unspecified: Secondary | ICD-10-CM | POA: Diagnosis not present

## 2021-12-04 DIAGNOSIS — E559 Vitamin D deficiency, unspecified: Secondary | ICD-10-CM | POA: Diagnosis not present

## 2021-12-04 LAB — BASIC METABOLIC PANEL
BUN/Creatinine Ratio: 14 (ref 12–28)
BUN: 12 mg/dL (ref 8–27)
CO2: 20 mmol/L (ref 20–29)
Calcium: 9.5 mg/dL (ref 8.7–10.3)
Chloride: 110 mmol/L — ABNORMAL HIGH (ref 96–106)
Creatinine, Ser: 0.88 mg/dL (ref 0.57–1.00)
Glucose: 92 mg/dL (ref 70–99)
Potassium: 3.7 mmol/L (ref 3.5–5.2)
Sodium: 145 mmol/L — ABNORMAL HIGH (ref 134–144)
eGFR: 73 mL/min/{1.73_m2} (ref >59)

## 2021-12-04 NOTE — Progress Notes (Signed)
Subjective:  Gina Lopez is a 66 y.o. female who presents for Chief Complaint  Patient presents with   1 month follow-up    1 month follow-up on Labs. No other concerns     Here for recheck on several issues.  I saw her in May 2023 for physical.  At that time vitamin D was low and she was advised to start vitamin D 2000 units daily which she is doing.  At her last visit she remained iron deficiency anemia stable for years.  She does not tolerate iron twice a day as it gives her constipation.  Currently she is trying to use iron once daily every day and then twice daily every other day.  She denies any bleeding or bruising.  She eats a lot of green vegetables, eats meat but not much red meat  Last visit her potassium was low so we asked her to cut back to half tablet chlorthalidone daily which she is doing  Last visit I recommended Wegovy to help with weight loss.  She does not want to use medication to help lose weight.  She recently had a bone density and mammogram done which we reviewed today   History of lymphedema and venous insufficiency-her vascular surgery appointment is coming up next month  No other aggravating or relieving factors.    No other c/o.  Past Medical History:  Diagnosis Date   Allergy    environmental in spring   Anemia    Arthritis    knees   Colon polyp 07/2015   tubular adenoma, Dr. Adela Lank   Heart murmur    benign, 2013 echo   History of echocardiogram 02/22/12   mild concentric LVH, EF 55%, left atrial mildly dilated, Dr. Rennis Golden   Hypertension    Lymphedema of leg    L>R, sees vein clinic, uses compression pump device at home daily; Vein Clinic   Obesity    Wears partial dentures    upper only   Current Outpatient Medications on File Prior to Visit  Medication Sig Dispense Refill   amLODipine (NORVASC) 10 MG tablet Take 1 tablet (10 mg total) by mouth daily. 90 tablet 3   atenolol (TENORMIN) 25 MG tablet Take 1 tablet (25 mg total) by  mouth daily. 90 tablet 3   chlorthalidone (HYGROTON) 25 MG tablet Take 0.5 tablets (12.5 mg total) by mouth daily. 90 tablet 3   Cholecalciferol (VITAMIN D) 50 MCG (2000 UT) CAPS Take 1 capsule (2,000 Units total) by mouth daily. 90 capsule 3   iron polysaccharides (FERREX 150) 150 MG capsule Take 1 capsule (150 mg total) by mouth 2 (two) times daily. 180 capsule 1   vitamin B-12 (CYANOCOBALAMIN) 100 MCG tablet Take 100 mcg by mouth daily. (Patient not taking: Reported on 12/04/2021)     No current facility-administered medications on file prior to visit.     The following portions of the patient's history were reviewed and updated as appropriate: allergies, current medications, past family history, past medical history, past social history, past surgical history and problem list.  ROS Otherwise as in subjective above  Objective: BP 130/80   Pulse 71   Wt (!) 306 lb 12.8 oz (139.2 kg)   BMI 46.65 kg/m   General appearance: alert, no distress, well developed, well nourished Otherwise not examined    Assessment: Encounter Diagnoses  Name Primary?   Hypokalemia Yes   Iron deficiency anemia, unspecified iron deficiency anemia type    Vitamin D deficiency  Essential hypertension      Plan: Hypokalemia-last visit 10/2021, I asked her to cut back chlorthalidone to 1/2 tablet daily which she is doing.  Recheck labs today.  As long as her potassium back to normal she will stay at 1/2 tablet of chlorthalidone daily  Iron deficiency anemia-stable for the last several years.  Not tolerating iron twice a day suggest continue iron polysaccharide once daily.  She has tried several other iron versions in the past and all of them gave her constipation.  Vitamin D deficiency-compliant with vitamin D 2000 units daily added at last visit in May 2023  Hypertension-continue amlodipine 10 mg daily, atenolol 25 mg daily, chlorthalidone 25 mg, 1/2 tablet daily  Obesity-continue efforts to  healthy diet and exercise.  We discussed several strategies.  She does not want to use medication to help lose weight   Follow up: pending lab

## 2021-12-10 ENCOUNTER — Encounter (HOSPITAL_COMMUNITY): Payer: Self-pay | Admitting: Emergency Medicine

## 2021-12-10 ENCOUNTER — Ambulatory Visit (HOSPITAL_COMMUNITY)
Admission: EM | Admit: 2021-12-10 | Discharge: 2021-12-10 | Disposition: A | Discharge status: Home / Self Care | Payer: 59 | Attending: Family Medicine | Admitting: Family Medicine

## 2021-12-10 ENCOUNTER — Ambulatory Visit (INDEPENDENT_AMBULATORY_CARE_PROVIDER_SITE_OTHER): Payer: 59

## 2021-12-10 DIAGNOSIS — M545 Low back pain, unspecified: Secondary | ICD-10-CM

## 2021-12-10 MED ORDER — KETOROLAC TROMETHAMINE 30 MG/ML IJ SOLN
INTRAMUSCULAR | Status: AC
  Filled 2021-12-10: qty 1

## 2021-12-10 MED ORDER — KETOROLAC TROMETHAMINE 30 MG/ML IJ SOLN
30.0000 mg | Freq: Once | INTRAMUSCULAR | Status: AC
  Administered 2021-12-10: 30 mg via INTRAMUSCULAR

## 2021-12-10 MED ORDER — IBUPROFEN 600 MG PO TABS: 600.0000 mg | ORAL_TABLET | Freq: Three times a day (TID) | ORAL | 0 refills | Status: DC | PRN

## 2021-12-10 NOTE — ED Provider Notes (Signed)
[facility]    CSN: [id number] Arrival date & time: 12/10/21  1606      History   Chief Complaint Chief Complaint  Patient presents with   Hip Pain   Back Pain    HPI Gina Lopez is a 66 y.o. female.    Hip Pain  Back Pain  Here for a 1 month history of low back and bilateral buttock pain.  No history of trauma or fall.  The pain does not really radiate down her legs.  She does have some pain in her lower legs bilaterally.  No rash, fever, hematuria or dysuria.  No bowel or bladder incontinence is new.  Renal function is normal on recent labs done in May and June of this year  Past Medical History:  Diagnosis Date   Allergy    environmental in spring   Anemia    Arthritis    knees   Colon polyp 07/2015   tubular adenoma, Dr. Havery Moros   Heart murmur    benign, 2013 echo   History of echocardiogram 02/22/12   mild concentric LVH, EF 55%, left atrial mildly dilated, Dr. Debara Pickett   Hypertension    Lymphedema of leg    L>R, sees vein clinic, uses compression pump device at home daily; [facility] Clinic   Obesity    Wears partial dentures    upper only    Patient Active Problem List   Diagnosis Date Noted   Hypokalemia 12/04/2021   Encounter for screening mammogram for malignant neoplasm of breast 10/27/2021   Postmenopausal estrogen deficiency 10/27/2021   Screening for lipid disorders 10/27/2021   Hepatic steatosis 10/27/2021   Vitamin D deficiency 08/10/2021   Impaired fasting blood sugar 08/10/2021   Need for influenza vaccination 08/09/2020   Pain in both feet 08/09/2020   Post-menopausal 08/09/2020   Estrogen deficiency 08/09/2020   Flat feet 08/09/2020   Plantar fasciitis 08/09/2020   Multinodular goiter 01/26/2020   Allergic rhinitis due to pollen 09/04/2019   Leg cramp 09/04/2019   Digital mucinous cyst of finger of right hand 05/28/2017   Vaccine counseling 05/28/2017   Iron deficiency anemia 03/27/2016   Lymphedema 05/18/2015    Encounter for health maintenance examination in adult 05/18/2015   Obesity 05/18/2015   Screening for diabetes mellitus 05/18/2015   Screening for cervical cancer 05/18/2015   Need for prophylactic vaccination and inoculation against influenza 05/18/2015   Essential hypertension 04/29/2015   LVH (left ventricular hypertrophy) 04/29/2015   Heart murmur 01/09/2012    Past Surgical History:  Procedure Laterality Date   COLONOSCOPY     age 50yo due to blood in stool, [place]   COLONOSCOPY  07/2015   tubular adenoma polyp, repeat 5 years, Dr. Havery Moros   CYST EXCISION  01/14/15   finger   FINGER SURGERY     reattachment of tip of pinky finger on left hand   POLYPECTOMY     VEIN LIGATION AND STRIPPING     [facility]    OB History   No obstetric history on file.      Home Medications    Prior to Admission medications   Medication Sig Start Date End Date Taking? Authorizing Provider  ibuprofen (ADVIL) 600 MG tablet Take 1 tablet (600 mg total) by mouth every 8 (eight) hours as needed (pain). 12/10/21  Yes Barrett Henle, MD  amLODipine (NORVASC) 10 MG tablet Take 1 tablet (10 mg total) by mouth daily. 10/27/21   Tysinger,  Camelia Eng, PA-C  atenolol (TENORMIN) 25 MG tablet Take 1 tablet (25 mg total) by mouth daily. 10/27/21   Tysinger, Camelia Eng, PA-C  chlorthalidone (HYGROTON) 25 MG tablet Take 0.5 tablets (12.5 mg total) by mouth daily. 10/31/21   Tysinger, Camelia Eng, PA-C  Cholecalciferol (VITAMIN D) 50 MCG (2000 UT) CAPS Take 1 capsule (2,000 Units total) by mouth daily. 10/31/21   Tysinger, Camelia Eng, PA-C  iron polysaccharides (FERREX 150) 150 MG capsule Take 1 capsule (150 mg total) by mouth 2 (two) times daily. 10/31/21   Tysinger, Camelia Eng, PA-C  vitamin B-12 (CYANOCOBALAMIN) 100 MCG tablet Take 100 mcg by mouth daily. Patient not taking: Reported on 12/04/2021    [provider]    Family History Family History  Problem Relation Age of Onset   Arthritis  Mother    Hypertension Mother    Obesity Mother    Hip fracture Mother    Kidney disease Mother    Alcohol abuse Father    Cancer Sister        type unknown   Diabetes Brother    Heart disease Neg Hx    Stroke Neg Hx    Colon cancer Neg Hx    Stomach cancer Neg Hx    Rectal cancer Neg Hx    Colon polyps Neg Hx    Breast cancer Neg Hx     Social History Social History   Tobacco Use   Smoking status: Former    Packs/day: 0.25    Years: 1.00    Total pack years: 0.25    Types: Cigarettes    Quit date: 1983    Years since quitting: 40.5   Smokeless tobacco: Never  Vaping Use   Vaping Use: Never used  Substance Use Topics   Alcohol use: Yes    Comment: rare   Drug use: No     Allergies   Codeine and Terbinafine and related   Review of Systems Review of Systems  Musculoskeletal:  Positive for back pain.     Physical Exam Triage Vital Signs ED Triage Vitals  Enc Vitals Group     BP 12/10/21 1625 133/64     Pulse Rate 12/10/21 1625 67     Resp 12/10/21 1625 19     Temp 12/10/21 1625 98.5 F (36.9 C)     Temp Source 12/10/21 1625 Oral     SpO2 12/10/21 1625 98 %     Weight --      Height --      Head Circumference --      Peak Flow --      Pain Score 12/10/21 1624 9     Pain Loc --      Pain Edu? --      Excl. in [place]? --    No data found.  Updated Vital Signs BP 133/64 (BP Location: Left Arm)   Pulse 67   Temp 98.5 F (36.9 C) (Oral)   Resp 19   SpO2 98%   Visual Acuity Right Eye Distance:   Left Eye Distance:   Bilateral Distance:    Right Eye Near:   Left Eye Near:    Bilateral Near:     Physical Exam Vitals reviewed.  Constitutional:      General: She is not in acute distress.    Appearance: She is not toxic-appearing.  HENT:     Mouth/Throat:     Pharynx: No posterior oropharyngeal erythema.  Eyes:  Extraocular Movements: Extraocular movements intact.     Pupils: Pupils are equal, round, and reactive to light.   Cardiovascular:     Rate and Rhythm: Normal rate and regular rhythm.     Heart sounds: No murmur heard. Pulmonary:     Effort: Pulmonary effort is normal.     Breath sounds: Normal breath sounds.  Musculoskeletal:     Comments: SLR neg  Skin:    Capillary Refill: Capillary refill takes less than 2 seconds.     Coloration: Skin is not jaundiced or pale.  Neurological:     General: No focal deficit present.     Mental Status: She is alert and oriented to person, place, and time.  Psychiatric:        Behavior: Behavior normal.      UC Treatments / Results  Labs (all labs ordered are listed, but only abnormal results are displayed) Labs Reviewed - No data to display  EKG   Radiology DG Lumbar Spine 2-3 Views  Result Date: 12/10/2021 CLINICAL DATA:  Low back pain for 1 month. EXAM: LUMBAR SPINE - 2-3 VIEW COMPARISON:  None Available. FINDINGS: There is no evidence of lumbar spine fracture. Mild degenerative disc disease is seen at L4-5. Lower lumbar facet DJD also noted. Grade 1 anterolisthesis is seen at L4-5 measuring 8 mm. IMPRESSION: No acute findings. Lower lumbar degenerative spondylosis, with grade 1 anterolisthesis at L4-5. Electronically Signed   By: Marlaine Hind M.D.   On: 12/10/2021 17:11    Procedures Procedures (including critical care time)  Medications Ordered in UC Medications  ketorolac (TORADOL) 30 MG/ML injection 30 mg (has no administration in time range)    Initial Impression / Assessment and Plan / UC Course  I have reviewed the triage vital signs and the nursing notes.  Pertinent labs & imaging results that were available during my care of the patient were reviewed by me and considered in my medical decision making (see chart for details).     X-rays show some degenerative changes and spondylosis with grade 1 anterolisthesis at L4-L5 We will treat with Toradol and some ibuprofen short-term.  She will talk to her PCP office tomorrow about  follow-up Final Clinical Impressions(s) / UC Diagnoses   Final diagnoses:  Acute bilateral low back pain without sciatica     Discharge Instructions      Your x-rays did show some degenerative changes of the low back.  You have been given a shot of Toradol 30 mg today.  Take ibuprofen 600 mg--1 tab every 8 hours as needed for pain.      ED Prescriptions     Medication Sig Dispense Auth. Provider   ibuprofen (ADVIL) 600 MG tablet Take 1 tablet (600 mg total) by mouth every 8 (eight) hours as needed (pain). 21 tablet Anielle Headrick, Gwenlyn Perking, MD      PDMP not reviewed this encounter.   Barrett Henle, MD 12/10/21 1728

## 2021-12-10 NOTE — Discharge Instructions (Addendum)
Your x-rays did show some degenerative changes of the low back.  You have been given a shot of Toradol 30 mg today.  Take ibuprofen 600 mg--1 tab every 8 hours as needed for pain.

## 2021-12-10 NOTE — ED Triage Notes (Signed)
Pt c/o bilat hip/lower back pains for a month. Reports started out intermittent now constant. Denies falls or injury. Denies urinary or bowl problems.

## 2021-12-13 ENCOUNTER — Ambulatory Visit: Payer: 59 | Admitting: Vascular Surgery

## 2021-12-13 ENCOUNTER — Ambulatory Visit (HOSPITAL_COMMUNITY)
Admission: RE | Admit: 2021-12-13 | Discharge: 2021-12-13 | Disposition: A | Discharge status: Home / Self Care | Payer: 59 | Source: Ambulatory Visit | Attending: Vascular Surgery | Admitting: Vascular Surgery

## 2021-12-13 ENCOUNTER — Encounter: Payer: Self-pay | Admitting: Vascular Surgery

## 2021-12-13 VITALS — BP 158/82 | HR 72 | Temp 98.3°F | Resp 16 | Ht 68.0 in | Wt 300.0 lb

## 2021-12-13 DIAGNOSIS — I872 Venous insufficiency (chronic) (peripheral): Secondary | ICD-10-CM | POA: Diagnosis not present

## 2021-12-13 DIAGNOSIS — I890 Lymphedema, not elsewhere classified: Secondary | ICD-10-CM | POA: Diagnosis present

## 2021-12-13 NOTE — Progress Notes (Signed)
ASSESSMENT & PLAN   COMBINED CHRONIC VENOUS INSUFFICIENCY AND LYMPHEDEMA: This patient has predominantly lymphedema of both lower extremities but based on the venous duplex scan today also has an underlying component of chronic venous insufficiency on the left.  She has deep venous reflux with no significant superficial venous reflux.  We have discussed the importance of intermittent leg elevation and the proper positioning for this.  I think she should wear a knee-high compression stocking with a gradient of 20 to 30 mmHg instead of 15-20 if she will tolerate this.  We have discussed importance of exercise specifically walking and water aerobics.  I have encouraged her to avoid prolonged sitting and standing.  We also discussed the importance of maintaining a healthy weight as central obesity especially increases lower extremity venous pressure.  She does have a lymphedema pump for the left leg and I think she should resume using this for at least an hour a day if possible.  I have also asked her to contact [employer] to see if she could potentially get a device for the right side also.  I will be happy to see her back at any time if her swelling progresses.  I have explained that this does not tend to gradually worsen and that she makes all of these conservative measures part of her lifestyle.  REASON FOR CONSULT:    Lymphedema.  The consult is requested by Chana Bode, PA.  HPI:   JARICA PLASS is a 66 y.o. female who is referred with bilateral lower extremity lymphedema.  I have reviewed the records from the referring office.  The patient was seen on 12/04/2021.  The patient has a history of lymphedema and chronic venous insufficiency and is sent for vascular consultation.  This patient has a long history of bilateral lower extremity swelling.  She developed a gradual onset of swelling in the right leg at age 30.  She subsequently developed a gradual onset of swelling in the left leg and aged  45.  The swelling on the left has been significantly worse.  She denies any previous history of DVT or phlebitis.  She does have a lymphedema pump for the left leg only.  She admits that she has not been using this much lately.  She has not been elevating her legs.  She does wear knee-high compression garment with a gradient of 15 to 20 mmHg.  Of note she has had a previous left great saphenous vein stripped elsewhere.  She works at the [employer] where she stands for 12 hours a day but is moving around quite a bit.  She recently has developed some pain in the lateral aspect of her legs related to her swelling.    Past Medical History:  Diagnosis Date   Allergy    environmental in spring   Anemia    Arthritis    knees   Colon polyp 07/2015   tubular adenoma, Dr. Havery Moros   Heart murmur    benign, 2013 echo   History of echocardiogram 02/22/12   mild concentric LVH, EF 55%, left atrial mildly dilated, Dr. Debara Pickett   Hypertension    Lymphedema of leg    L>R, sees vein clinic, uses compression pump device at home daily; Vein Clinic   Obesity    Wears partial dentures    upper only    Family History  Problem Relation Age of Onset   Arthritis Mother    Hypertension Mother    Obesity Mother  Hip fracture Mother    Kidney disease Mother    Alcohol abuse Father    Cancer Sister        type unknown   Diabetes Brother    Heart disease Neg Hx    Stroke Neg Hx    Colon cancer Neg Hx    Stomach cancer Neg Hx    Rectal cancer Neg Hx    Colon polyps Neg Hx    Breast cancer Neg Hx     SOCIAL HISTORY: Social History   Tobacco Use   Smoking status: Former    Packs/day: 0.25    Years: 1.00    Total pack years: 0.25    Types: Cigarettes    Quit date: 1983    Years since quitting: 40.5   Smokeless tobacco: Never  Substance Use Topics   Alcohol use: Yes    Comment: rare    Allergies  Allergen Reactions   Codeine Other (See Comments)    Pt states it makes her feel  dizzy and stay in her system forever   Terbinafine And Related Hives and Rash    Current Outpatient Medications  Medication Sig Dispense Refill   amLODipine (NORVASC) 10 MG tablet Take 1 tablet (10 mg total) by mouth daily. 90 tablet 3   atenolol (TENORMIN) 25 MG tablet Take 1 tablet (25 mg total) by mouth daily. 90 tablet 3   chlorthalidone (HYGROTON) 25 MG tablet Take 0.5 tablets (12.5 mg total) by mouth daily. 90 tablet 3   Cholecalciferol (VITAMIN D) 50 MCG (2000 UT) CAPS Take 1 capsule (2,000 Units total) by mouth daily. 90 capsule 3   ibuprofen (ADVIL) 600 MG tablet Take 1 tablet (600 mg total) by mouth every 8 (eight) hours as needed (pain). 21 tablet 0   iron polysaccharides (FERREX 150) 150 MG capsule Take 1 capsule (150 mg total) by mouth 2 (two) times daily. 180 capsule 1   vitamin B-12 (CYANOCOBALAMIN) 100 MCG tablet Take 100 mcg by mouth daily.     No current facility-administered medications for this visit.    REVIEW OF SYSTEMS:  '[X]'$  denotes positive finding, '[ ]'$  denotes negative finding Cardiac  Comments:  Chest pain or chest pressure:    Shortness of breath upon exertion:    Short of breath when lying flat:    Irregular heart rhythm:        Vascular    Pain in calf, thigh, or hip brought on by ambulation: x   Pain in feet at night that wakes you up from your sleep:     Blood clot in your veins:    Leg swelling:  x       Pulmonary    Oxygen at home:    Productive cough:     Wheezing:         Neurologic    Sudden weakness in arms or legs:     Sudden numbness in arms or legs:     Sudden onset of difficulty speaking or slurred speech:    Temporary loss of vision in one eye:     Problems with dizziness:         Gastrointestinal    Blood in stool:     Vomited blood:         Genitourinary    Burning when urinating:     Blood in urine:        Psychiatric    Major depression:         Hematologic  Bleeding problems:    Problems with blood clotting too  easily:        Skin    Rashes or ulcers:        Constitutional    Fever or chills:    -  PHYSICAL EXAM:   Vitals:   12/13/21 1349  BP: (!) 158/82  Pulse: 72  Resp: 16  Temp: 98.3 F (36.8 C)  TempSrc: Temporal  SpO2: 100%  Weight: 300 lb (136.1 kg)  Height: '5\' 8"'$  (1.727 m)   Body mass index is 45.61 kg/m.  GENERAL: The patient is a well-nourished female, in no acute distress. The vital signs are documented above. CARDIAC: There is a regular rate and rhythm.  VASCULAR: I do not detect carotid bruits. Because of the swelling is difficult to palpate pedal pulses.  However, she has a biphasic dorsalis pedis and posterior tibial signal bilaterally. She has significant bilateral lower extremity swelling which is more significant on the left side.  She has a positive Stemmer's sign bilaterally.   PULMONARY: There is good air exchange bilaterally without wheezing or rales. ABDOMEN: Soft and non-tender with normal pitched bowel sounds.  MUSCULOSKELETAL: There are no major deformities. NEUROLOGIC: No focal weakness or paresthesias are detected. SKIN: There are no ulcers or rashes noted. PSYCHIATRIC: The patient has a normal affect.  DATA:    VENOUS DUPLEX: I have independently interpreted her venous duplex scan today.  This was of the left lower extremity only.  There was no evidence of DVT.  There was deep venous reflux in the common femoral vein and popliteal vein.  There was no significant superficial venous reflux.  The left great saphenous vein has been previously stripped.  Deitra Mayo [facility]

## 2022-02-14 ENCOUNTER — Encounter: Payer: Self-pay | Admitting: Internal Medicine

## 2022-03-20 ENCOUNTER — Encounter: Payer: Self-pay | Admitting: Internal Medicine

## 2022-06-29 ENCOUNTER — Telehealth: Payer: 59 | Admitting: Nurse Practitioner

## 2022-06-29 DIAGNOSIS — J040 Acute laryngitis: Secondary | ICD-10-CM

## 2022-06-29 NOTE — Patient Instructions (Signed)
Coricidin HBP  Honey cough drops Throat coat tea  Humidifier in room  Push fluids  Voice rest

## 2022-06-29 NOTE — Progress Notes (Signed)
Virtual Visit Consent   Gina Lopez, you are scheduled for a virtual visit with a [facility] provider today. Just as with appointments in the office, your consent must be obtained to participate. Your consent will be active for this visit and any virtual visit you may have with one of our providers in the next 365 days. If you have a MyChart account, a copy of this consent can be sent to you electronically.  As this is a virtual visit, video technology does not allow for your provider to perform a traditional examination. This may limit your provider's ability to fully assess your condition. If your provider identifies any concerns that need to be evaluated in person or the need to arrange testing (such as labs, EKG, etc.), we will make arrangements to do so. Although advances in technology are sophisticated, we cannot ensure that it will always work on either your end or our end. If the connection with a video visit is poor, the visit may have to be switched to a telephone visit. With either a video or telephone visit, we are not always able to ensure that we have a secure connection.  By engaging in this virtual visit, you consent to the provision of healthcare and authorize for your insurance to be billed (if applicable) for the services provided during this visit. Depending on your insurance coverage, you may receive a charge related to this service.  I need to obtain your verbal consent now. Are you willing to proceed with your visit today? Gina Lopez has provided verbal consent on 06/29/2022 for a virtual visit (video or telephone). Apolonio Schneiders, FNP  Date: 06/29/2022 11:26 AM  Virtual Visit via Video Note   I, Apolonio Schneiders, connected with  Gina Lopez  ([id number], [id number] on 06/29/22 at 11:30 AM EST by a video-enabled telemedicine application and verified that I am speaking with the correct person using two identifiers.  Location: Patient: Virtual Visit Location Patient:  Home Provider: Virtual Visit Location Provider: Home Office   I discussed the limitations of evaluation and management by telemedicine and the availability of in person appointments. The patient expressed understanding and agreed to proceed.    History of Present Illness: Gina Lopez is a 67 y.o. who identifies as a female who was assigned female at birth, and is being seen today for sore throat and runny nose with loss of voice.  Symptom onset earlier this week  No fever  Overall she doesn't feel too bad she is mostly concerned about her voice  And having to work at [employer] all weekend   She has been using cough drops OTC   Problems:  Patient Active Problem List   Diagnosis Date Noted   Hypokalemia 12/04/2021   Encounter for screening mammogram for malignant neoplasm of breast 10/27/2021   Postmenopausal estrogen deficiency 10/27/2021   Screening for lipid disorders 10/27/2021   Hepatic steatosis 10/27/2021   Vitamin D deficiency 08/10/2021   Impaired fasting blood sugar 08/10/2021   Need for influenza vaccination 08/09/2020   Pain in both feet 08/09/2020   Post-menopausal 08/09/2020   Estrogen deficiency 08/09/2020   Flat feet 08/09/2020   Plantar fasciitis 08/09/2020   Multinodular goiter 01/26/2020   Allergic rhinitis due to pollen 09/04/2019   Leg cramp 09/04/2019   Digital mucinous cyst of finger of right hand 05/28/2017   Vaccine counseling 05/28/2017   Iron deficiency anemia 03/27/2016   Lymphedema 05/18/2015   Encounter for health maintenance  examination in adult 05/18/2015   Obesity 05/18/2015   Screening for diabetes mellitus 05/18/2015   Screening for cervical cancer 05/18/2015   Need for prophylactic vaccination and inoculation against influenza 05/18/2015   Essential hypertension 04/29/2015   LVH (left ventricular hypertrophy) 04/29/2015   Heart murmur 01/09/2012    Allergies:  Allergies  Allergen Reactions   Codeine Other (See Comments)     Pt states it makes her feel dizzy and stay in her system forever   Terbinafine And Related Hives and Rash   Medications:  Current Outpatient Medications:    amLODipine (NORVASC) 10 MG tablet, Take 1 tablet (10 mg total) by mouth daily., Disp: 90 tablet, Rfl: 3   atenolol (TENORMIN) 25 MG tablet, Take 1 tablet (25 mg total) by mouth daily., Disp: 90 tablet, Rfl: 3   chlorthalidone (HYGROTON) 25 MG tablet, Take 0.5 tablets (12.5 mg total) by mouth daily., Disp: 90 tablet, Rfl: 3   Cholecalciferol (VITAMIN D) 50 MCG (2000 UT) CAPS, Take 1 capsule (2,000 Units total) by mouth daily., Disp: 90 capsule, Rfl: 3   ibuprofen (ADVIL) 600 MG tablet, Take 1 tablet (600 mg total) by mouth every 8 (eight) hours as needed (pain)., Disp: 21 tablet, Rfl: 0   iron polysaccharides (FERREX 150) 150 MG capsule, Take 1 capsule (150 mg total) by mouth 2 (two) times daily., Disp: 180 capsule, Rfl: 1   vitamin B-12 (CYANOCOBALAMIN) 100 MCG tablet, Take 100 mcg by mouth daily., Disp: , Rfl:   Observations/Objective: Patient is well-developed, well-nourished in no acute distress.  Resting comfortably  at home.  Head is normocephalic, atraumatic.  No labored breathing.  Speech is clear and coherent with logical content.  Patient is alert and oriented at baseline.    Assessment and Plan: 1. Laryngitis Coricidin HBP  Honey cough drops Warm salt water gargles  Throat coat tea  Humidifier in room  Push fluids  Voice rest      Follow Up Instructions: I discussed the assessment and treatment plan with the patient. The patient was provided an opportunity to ask questions and all were answered. The patient agreed with the plan and demonstrated an understanding of the instructions.  A copy of instructions were sent to the patient via MyChart unless otherwise noted below.    The patient was advised to call back or seek an in-person evaluation if the symptoms worsen or if the condition fails to improve as  anticipated.  Time:  I spent 10 minutes with the patient via telehealth technology discussing the above problems/concerns.    Apolonio Schneiders, FNP

## 2022-08-13 ENCOUNTER — Emergency Department (HOSPITAL_BASED_OUTPATIENT_CLINIC_OR_DEPARTMENT_OTHER)
Admission: EM | Admit: 2022-08-13 | Discharge: 2022-08-13 | Disposition: A | Discharge status: Home / Self Care | Payer: 59 | Attending: Emergency Medicine | Admitting: Emergency Medicine

## 2022-08-13 ENCOUNTER — Emergency Department (HOSPITAL_BASED_OUTPATIENT_CLINIC_OR_DEPARTMENT_OTHER): Payer: 59

## 2022-08-13 ENCOUNTER — Other Ambulatory Visit: Payer: Self-pay

## 2022-08-13 DIAGNOSIS — L03116 Cellulitis of left lower limb: Secondary | ICD-10-CM | POA: Diagnosis not present

## 2022-08-13 DIAGNOSIS — Z79899 Other long term (current) drug therapy: Secondary | ICD-10-CM | POA: Insufficient documentation

## 2022-08-13 DIAGNOSIS — M79605 Pain in left leg: Secondary | ICD-10-CM | POA: Diagnosis present

## 2022-08-13 MED ORDER — CEPHALEXIN 500 MG PO CAPS
500.0000 mg | ORAL_CAPSULE | Freq: Four times a day (QID) | ORAL | 0 refills | Status: DC
Start: 2022-08-13 — End: 2022-08-22

## 2022-08-13 NOTE — ED Provider Notes (Signed)
[facility] Provider Note   CSN: [id number] Arrival date & time: 08/13/22  1456     History  Chief Complaint  Patient presents with   Leg Pain    Gina Lopez is a 67 y.o. female, history of lymphedema, who presents to the ED secondary to anterior left mid tibia/fibula pain that is been going on for the last 2 weeks.  States that 1 day she woke up and she noticed it, notes that it gets worse when she walks, states that the pain is worse when she is walking on flat ground, and denies any swelling, increased warmth to the area.  States that walking up stairs or walking downstairs does not bother it.  Has been wearing her old shoes, but she has new shoes just has not worn them yet.  Denies any history of blood clots, heart issues, or venous disease.  Has been taking Aleve without relief.     Home Medications Prior to Admission medications   Medication Sig Start Date End Date Taking? Authorizing Provider  cephALEXin (KEFLEX) 500 MG capsule Take 1 capsule (500 mg total) by mouth 4 (four) times daily. 08/13/22  Yes Iviana Blasingame L, PA  amLODipine (NORVASC) 10 MG tablet Take 1 tablet (10 mg total) by mouth daily. 10/27/21   Tysinger, Camelia Eng, PA-C  atenolol (TENORMIN) 25 MG tablet Take 1 tablet (25 mg total) by mouth daily. 10/27/21   Tysinger, Camelia Eng, PA-C  chlorthalidone (HYGROTON) 25 MG tablet Take 0.5 tablets (12.5 mg total) by mouth daily. 10/31/21   Tysinger, Camelia Eng, PA-C  Cholecalciferol (VITAMIN D) 50 MCG (2000 UT) CAPS Take 1 capsule (2,000 Units total) by mouth daily. 10/31/21   Tysinger, Camelia Eng, PA-C  ibuprofen (ADVIL) 600 MG tablet Take 1 tablet (600 mg total) by mouth every 8 (eight) hours as needed (pain). 12/10/21   Barrett Henle, MD  iron polysaccharides (FERREX 150) 150 MG capsule Take 1 capsule (150 mg total) by mouth 2 (two) times daily. 10/31/21   Tysinger, Camelia Eng, PA-C  vitamin B-12 (CYANOCOBALAMIN) 100 MCG tablet Take 100 mcg by  mouth daily.    [provider]      Allergies    Codeine and Terbinafine and related    Review of Systems   Review of Systems  Musculoskeletal:        +L leg pain  Skin:  Negative for rash.    Physical Exam Updated Vital Signs BP (!) 150/72 (BP Location: Right Arm)   Pulse 72   Temp 98.2 F (36.8 C)   Resp 18   SpO2 100%  Physical Exam Vitals and nursing note reviewed.  Constitutional:      General: She is not in acute distress.    Appearance: She is well-developed.  HENT:     Head: Normocephalic and atraumatic.  Eyes:     General:        Right eye: No discharge.        Left eye: No discharge.     Conjunctiva/sclera: Conjunctivae normal.  Pulmonary:     Effort: No respiratory distress.  Musculoskeletal:     Comments: LLE: Tenderness to palpation of mid anterior tibia/fibula, no evidence of ecchymoses, crepitus.  2+ dorsalis pedis pulses bilateral lower extremities.  Nonpitting edema to bilateral lower extremities w/pitting edema to anterior tibia/fibula.  Range of motion intact, flexion extension of knee as well as ankle.  Skin:    Comments: +erythema to anterior  leg w/mild pitting edema and increased warmth  Neurological:     Mental Status: She is alert.     Comments: Clear speech.   Psychiatric:        Behavior: Behavior normal.        Thought Content: Thought content normal.     ED Results / Procedures / Treatments   Labs (all labs ordered are listed, but only abnormal results are displayed) Labs Reviewed - No data to display  EKG None  Radiology US Venous Img Lower Unilateral Left  Result Date: 08/13/2022 CLINICAL DATA:  Left leg pain for 3 days EXAM: LEFT LOWER EXTREMITY VENOUS DOPPLER ULTRASOUND TECHNIQUE: Gray-scale sonography with graded compression, as well as color Doppler and duplex ultrasound were performed to evaluate the lower extremity deep venous systems from the level of the common femoral vein and including the common femoral,  femoral, profunda femoral, popliteal and calf veins including the posterior tibial, peroneal and gastrocnemius veins when visible. Spectral Doppler was utilized to evaluate flow at rest and with distal augmentation maneuvers in the common femoral, femoral and popliteal veins. COMPARISON:  None Available. FINDINGS: Contralateral Common Femoral Vein: Respiratory phasicity is normal and symmetric with the symptomatic side. No evidence of thrombus. Normal compressibility. Common Femoral Vein: No evidence of thrombus. Normal compressibility, respiratory phasicity and response to augmentation. Saphenofemoral Junction: No evidence of thrombus. Normal compressibility and flow on color Doppler imaging. Profunda Femoral Vein: No evidence of thrombus. Normal compressibility and flow on color Doppler imaging. Femoral Vein: No evidence of thrombus. Normal compressibility, respiratory phasicity and response to augmentation. Popliteal Vein: No evidence of thrombus. Normal compressibility, respiratory phasicity and response to augmentation. Calf Veins: Limited visualization of the calf tibial and peroneal veins related to body habitus and peripheral edema. Other Findings:  None. IMPRESSION: No significant femoropopliteal DVT. Limited visualization of the calf veins. Electronically Signed   By: Jerilynn Mages.  Shick M.D.   On: 08/13/2022 17:53   DG Tibia/Fibula Left  Result Date: 08/13/2022 CLINICAL DATA:  Anterior leg pain, no trauma EXAM: LEFT TIBIA AND FIBULA - 2 VIEW COMPARISON:  None Available. FINDINGS: There is no evidence of fracture or other focal bone lesions. Tricompartmental knee osteoarthritis with joint space narrowing and marginal osteophytes. Skin thickening and marked subcutaneous soft tissue edema. No appreciable fluid collection. IMPRESSION: 1. No acute osseous abnormality. 2. Tricompartmental knee osteoarthritis. 3. Skin thickening and marked subcutaneous soft tissue edema suggesting cellulitis. Electronically Signed    By: Keane Police D.O.   On: 08/13/2022 16:32    Procedures Procedures    Medications Ordered in ED Medications - No data to display  ED Course/ Medical Decision Making/ A&P                             Medical Decision Making Patient is a 67 year old female, here for left leg pain has been going on for the last 2 weeks, worse when walking.  Denies any redness, swelling of the leg.  Nursing ordered ultrasound to rule out DVT, we will obtain an x-ray, for further evaluation for bony tenderness to rule out an occult fracture.  Amount and/or Complexity of Data Reviewed Radiology: ordered.    Details: X-ray unremarkable, DVT studies non definitive secondary to patient's edema. Discussion of management or test interpretation with external provider(s): Findings of erythema and exam, negative x-ray, and nondefinitive DVT study, has signs of point cellulitis given the redness, increased warmth to the site, and  tenderness to palpation.  She is not on any kind of hormonal products, has not had any trauma to the area, and denies any recent surgeries, thus I think a DVT is less likely.  We discussed putting her on Keflex, strict return precautions.  We discussed that the DVT study was not definitive, and she voiced understanding, and was agreeable with discharge home, with strict return precautions.  Risk Prescription drug management.   Final Clinical Impression(s) / ED Diagnoses Final diagnoses:  Cellulitis of left lower extremity    Rx / DC Orders ED Discharge Orders          Ordered    cephALEXin (KEFLEX) 500 MG capsule  4 times daily        08/13/22 1804              Gina Lopez, [place] 08/13/22 1810    Wyvonnia Dusky, MD 08/13/22 2316

## 2022-08-13 NOTE — Discharge Instructions (Signed)
Today your findings are concerning for cellulitis, please take the antibiotics, and the pain should resolve.  Your DVT ultrasound was not definitive, thus a blood clot could not be ruled out but given your skin changes I believe this is less likely. If you do not have improvement in your pain, you have shortness of breath or worsening swelling please return to the ED. Follow-up with your PCP.

## 2022-08-13 NOTE — ED Triage Notes (Signed)
Pt reports mid-calf L leg pain x 2 weeks but gradually has gotten worse. Pt reports worse w/ movement, however it does not hurt when going up or down steps. Pt denies injury/trauma, no numbness/tingling. Hx of lymphedema.

## 2022-08-13 NOTE — ED Notes (Signed)
Pt verbalized understanding of d/c instructions, meds, and followup care. Denies questions. VSS, no distress noted. Steady gait to exit with all belongings. 

## 2022-08-22 ENCOUNTER — Ambulatory Visit: Payer: 59 | Admitting: Medical

## 2022-08-22 VITALS — BP 122/70 | HR 74 | Temp 97.6°F

## 2022-08-22 DIAGNOSIS — M7989 Other specified soft tissue disorders: Secondary | ICD-10-CM

## 2022-08-22 DIAGNOSIS — I872 Venous insufficiency (chronic) (peripheral): Secondary | ICD-10-CM

## 2022-08-22 DIAGNOSIS — I10 Essential (primary) hypertension: Secondary | ICD-10-CM

## 2022-08-22 DIAGNOSIS — I890 Lymphedema, not elsewhere classified: Secondary | ICD-10-CM

## 2022-08-22 NOTE — Progress Notes (Signed)
Subjective:  Gina Lopez is a 67 y.o. female who presents for Chief Complaint  Patient presents with   Leg Swelling    Leg swelling with pain. Went to ER and ortho and they said to follow-up with pcp     Here for c/o leg swelling with pain.  Went to emergency 2 weeks ago for same issues.  Emergency dept put her on antifolics for cellulitis, but this was changed by ortho yesterday as she wasn't seeing improvement.  Went to ortho yesterday.  They advised she f/u with PCP due to swelling worse than usual.     She is somewhat improved but still has pain in her left lower leg.  Swelling is really no different.  He is using compression hose daily.  She does not always use her lymphedema pumps.  No fever, no new complaint.  She is compliant with her medications.  She wonders if it is fluid pill would help.  Of note she already is on half a tablet of chlorthalidone daily.  No other aggravating or relieving factors.    No other c/o.  Past Medical History:  Diagnosis Date   Allergy    environmental in spring   Anemia    Arthritis    knees   Colon polyp 07/2015   tubular adenoma, Dr. Havery Moros   Heart murmur    benign, 2013 echo   History of echocardiogram 02/22/12   mild concentric LVH, EF 55%, left atrial mildly dilated, Dr. Debara Pickett   Hypertension    Lymphedema of leg    L>R, sees vein clinic, uses compression pump device at home daily; Vein Clinic   Obesity    Wears partial dentures    upper only   Current Outpatient Medications on File Prior to Visit  Medication Sig Dispense Refill   amLODipine (NORVASC) 10 MG tablet Take 1 tablet (10 mg total) by mouth daily. 90 tablet 3   atenolol (TENORMIN) 25 MG tablet Take 1 tablet (25 mg total) by mouth daily. 90 tablet 3   chlorthalidone (HYGROTON) 25 MG tablet Take 0.5 tablets (12.5 mg total) by mouth daily. 90 tablet 3   Cholecalciferol (VITAMIN D) 50 MCG (2000 UT) CAPS Take 1 capsule (2,000 Units total) by mouth daily. 90 capsule 3    doxycycline (ADOXA) 100 MG tablet Take 100 mg by mouth 2 (two) times daily.     iron polysaccharides (FERREX 150) 150 MG capsule Take 1 capsule (150 mg total) by mouth 2 (two) times daily. 180 capsule 1   vitamin B-12 (CYANOCOBALAMIN) 100 MCG tablet Take 100 mcg by mouth daily. (Patient not taking: Reported on 08/22/2022)     No current facility-administered medications on file prior to visit.   Past Surgical History:  Procedure Laterality Date   COLONOSCOPY     age 64yo due to blood in stool, [facility]   COLONOSCOPY  07/2015   tubular adenoma polyp, repeat 5 years, Dr. Havery Moros   CYST EXCISION  01/14/15   finger   FINGER SURGERY     reattachment of tip of pinky finger on left hand   POLYPECTOMY     VEIN LIGATION AND STRIPPING     [facility]     The following portions of the patient's history were reviewed and updated as appropriate: allergies, current medications, past family history, past medical history, past social history, past surgical history and problem list.  ROS Otherwise as in subjective above  Objective: BP 122/70   Pulse 74  Temp 97.6 F (36.4 C)   Wt Readings from Last 3 Encounters:  12/13/21 300 lb (136.1 kg)  12/04/21 (!) 306 lb 12.8 oz (139.2 kg)  10/27/21 300 lb (136.1 kg)   General appearance: alert, no distress, well developed, well nourished Bilateral legs with obvious lymphedema findings, left lower leg with no obvious erythema or palpable cord or discoloration or warmth.  She is somewhat tender to palpation of the left lower leg Pulses: 2+ radial pulses, 2+ pedal pulses, normal cap refill    Assessment: Encounter Diagnoses  Name Primary?   Leg swelling Yes   Lymphedema    Chronic venous insufficiency    Essential hypertension      Plan: I reviewed her 12/2021 visit notes with vascular surgery, and they had recommended intermittent leg elevation, they advised increasing strength of ted hose from 15-57mHg to 20-30 mmHg.  She  apparently is still using the lower strength.  She only occasionally uses the lymphedema pump.  Advised the stronger Ted hose and continue lymphedema pump.    Patient Instructions  Chronic venous insufficieny and lymphedema  Recommendations: Consider increasing to 20-30 mmHg TED hose and wear them daily Continue leg elevation when possible Walk for exercise regularly Consider using your lymphedema pumps For the time being increase your chlorthalidone fluid pill to a full tablet or 25 mg daily Add back potassium pill daily while on the full tablet of chlorthalidone For the next week using aspirin 325 mg daily for vein inflammation and leg pain If things improve in the next week you could consider taking a baby aspirin 81 mg daily for heart disease prevention In the future when you get leg pain leg inflammation such as what you are experiencing now you can do aspirin 325 mg for a week or so for inflammation and pain Finish out your antibiotic   HEyvawas seen today for leg swelling.  Diagnoses and all orders for this visit:  Leg swelling  Lymphedema  Chronic venous insufficiency  Essential hypertension   Follow up: 2-3 weeks

## 2022-08-22 NOTE — Patient Instructions (Signed)
Chronic venous insufficieny and lymphedema  Recommendations: Consider increasing to 20-30 mmHg TED hose and wear them daily Continue leg elevation when possible Walk for exercise regularly Consider using your lymphedema pumps For the time being increase your chlorthalidone fluid pill to a full tablet or 25 mg daily Add back potassium pill daily while on the full tablet of chlorthalidone For the next week using aspirin 325 mg daily for vein inflammation and leg pain If things improve in the next week you could consider taking a baby aspirin 81 mg daily for heart disease prevention In the future when you get leg pain leg inflammation such as what you are experiencing now you can do aspirin 325 mg for a week or so for inflammation and pain Finish out your antibiotic

## 2022-09-03 ENCOUNTER — Telehealth: Payer: Self-pay | Admitting: Medical

## 2022-09-03 MED ORDER — POLYSACCHARIDE IRON COMPLEX 150 MG PO CAPS: 150.0000 mg | ORAL_CAPSULE | Freq: Two times a day (BID) | ORAL | 0 refills | Status: DC

## 2022-09-03 NOTE — Telephone Encounter (Signed)
Pharmacy sent request for ferrex please send to the [facility] ([place]), [place] - [address]

## 2022-09-03 NOTE — Telephone Encounter (Signed)
done

## 2022-09-11 ENCOUNTER — Telehealth: Payer: Self-pay | Admitting: Medical

## 2022-09-11 NOTE — Telephone Encounter (Signed)
Gina Lopez called, she has an appointment with you this Thursday but she wanted me to send a message back about her Ortho doctor that put her on tremedol for her leg pain and she says it is making her dizzy and she hasn't taken it for 2 days now but still isn't feeling herself. Is there anything she can do to help with the side effects?

## 2022-09-12 NOTE — Telephone Encounter (Signed)
Pt was notified of results

## 2022-09-13 ENCOUNTER — Ambulatory Visit: Payer: 59 | Admitting: Medical

## 2022-09-13 ENCOUNTER — Encounter: Payer: Self-pay | Admitting: Medical

## 2022-09-13 VITALS — BP 130/80 | HR 72 | Temp 97.7°F | Wt 293.0 lb

## 2022-09-13 DIAGNOSIS — I890 Lymphedema, not elsewhere classified: Secondary | ICD-10-CM

## 2022-09-13 DIAGNOSIS — M79605 Pain in left leg: Secondary | ICD-10-CM

## 2022-09-13 MED ORDER — IBUPROFEN 800 MG PO TABS: 800.0000 mg | ORAL_TABLET | Freq: Two times a day (BID) | ORAL | 0 refills | Status: DC

## 2022-09-13 NOTE — Progress Notes (Signed)
done

## 2022-09-13 NOTE — Addendum Note (Signed)
Addended by: Minette Headland A on: 09/13/2022 09:56 AM   Modules accepted: Orders

## 2022-09-13 NOTE — Progress Notes (Signed)
Subjective:  Gina Lopez is a 67 y.o. female who presents for Chief Complaint  Patient presents with   Leg Swelling    Leg swelling and pain on left leg.      Here for ongoing issues with leg.   Had seen ortho recently 09/04/22.   Was put on orthotic boot, and she had been wearing this.  Swelling improved some but pain did not.  No recent injury or trauma.    Started hurting out of the blue.   Did have some discoloration on front of lower leg initially but that resolved.  Had xray of leg but not foot.   Had xrays of tib /fib at ED on 08/13/22 and recently with ortho.  No recent foot xray.  Leg mainly hurts in front of lower leg.   Ended up using 2 rounds of antibiotics after the findings on 08/13/22 xray with no improvement.  No other aggravating or relieving factors.    No other c/o.  The following portions of the patient's history were reviewed and updated as appropriate: allergies, current medications, past family history, past medical history, past social history, past surgical history and problem list.  ROS Otherwise as in subjective above  Objective: BP 130/80   Pulse 72   Temp 97.7 F (36.5 C)   Wt 293 lb (132.9 kg)   BMI 44.55 kg/m   Wt Readings from Last 3 Encounters:  09/13/22 293 lb (132.9 kg)  12/13/21 300 lb (136.1 kg)  12/04/21 (!) 306 lb 12.8 oz (139.2 kg)    General appearance: alert, no distress, well developed, well nourished Bilat lower legs with generalized lymphedema, unchanged, anterior left lower leg lower 1/3 with somewhat tense skin but no warmth, erythema, palpable cord or discoloration Lower extremity pulses 1+ bilateral She is tender over the lower leg in the area of the tense skin Otherwise knee and hip nontender with normal range of motion   Assessment: Encounter Diagnoses  Name Primary?   Left leg pain Yes   Lymphedema      Plan: We discussed her concerns and symptoms and exam findings.  She was initially seen in the emergency department On  08/13/2022.  I reviewed the venous ultrasound and tibia-fibula x-ray.  There was no DVT and no obvious bony abnormality but there was some soft tissue thickening.  There was concern for cellulitis.  She finished 2 rounds of antibiotics and that did not improve things.  She has also used aspirin and heat without improvement.  She just saw orthopedic recently and started a boot/Cam walker.  I recommend she continue the boot/Cam walker for another 2 to 3 weeks  Begin anti-inflammatory below, relative rest.  Referral to physical therapy for some manual therapy to help with the symptoms.  No obvious sign of erythema nodosum, no obvious cellulitis  This may represent some soft tissue inflammation  Gina Lopez was seen today for leg swelling.  Diagnoses and all orders for this visit:  Left leg pain  Lymphedema  Other orders -     ibuprofen (ADVIL) 800 MG tablet; Take 1 tablet (800 mg total) by mouth in the morning and at bedtime.    Follow up: Pending referral to therapy

## 2022-09-21 ENCOUNTER — Encounter: Payer: Self-pay | Admitting: Medical

## 2022-09-27 ENCOUNTER — Encounter: Payer: Self-pay | Admitting: Medical

## 2022-09-27 ENCOUNTER — Ambulatory Visit: Payer: 59 | Admitting: Medical

## 2022-09-27 VITALS — BP 130/70 | HR 64 | Wt 302.0 lb

## 2022-09-27 DIAGNOSIS — I878 Other specified disorders of veins: Secondary | ICD-10-CM

## 2022-09-27 DIAGNOSIS — M79605 Pain in left leg: Secondary | ICD-10-CM | POA: Diagnosis not present

## 2022-09-27 DIAGNOSIS — I890 Lymphedema, not elsewhere classified: Secondary | ICD-10-CM | POA: Diagnosis not present

## 2022-09-27 MED ORDER — GABAPENTIN 100 MG PO CAPS: 100.0000 mg | ORAL_CAPSULE | Freq: Every day | ORAL | 3 refills | Status: DC

## 2022-09-27 MED ORDER — IBUPROFEN 800 MG PO TABS
800.0000 mg | ORAL_TABLET | Freq: Two times a day (BID) | ORAL | 0 refills | Status: DC
Start: 2022-09-27 — End: 2022-12-17

## 2022-09-27 NOTE — Progress Notes (Signed)
Subjective:  Gina Lopez is a 67 y.o. female who presents for Chief Complaint  Patient presents with   Follow-up    No improvement in left leg pain     Here for recheck on legs.  I saw her recently on 09/13/2022 for the same.  Since last visit she use ibuprofen, CAM Walker boot, has started physical therapy with 2 sessions so far.  She saw orthopedics since I last saw her and they discontinued the cam walker and advise she wean off of this.  So far she does not see any improvement.  From her last visit, she had seen ortho recently 09/04/22.   Was put on orthotic boot, and she had been wearing this.  Swelling improved some but pain did not.  No recent injury or trauma.    Started hurting out of the blue.   Did have some discoloration on front of lower leg initially but that resolved.  Had xray of leg but not foot.   Had xrays of tib /fib at ED on 08/13/22 and recently with ortho.  No recent foot xray.  Leg mainly hurts in front of lower leg.   Ended up using 2 rounds of antibiotics after the findings on 08/13/22 xray with no improvement.  No other aggravating or relieving factors.    No other c/o.  The following portions of the patient's history were reviewed and updated as appropriate: allergies, current medications, past family history, past medical history, past social history, past surgical history and problem list.  ROS Otherwise as in subjective above  Objective: BP 130/70 (BP Location: Left Arm, Patient Position: Sitting, Cuff Size: Large)   Pulse 64   Wt (!) 302 lb (137 kg)   SpO2 99%   BMI 45.92 kg/m   Wt Readings from Last 3 Encounters:  09/27/22 (!) 302 lb (137 kg)  09/13/22 293 lb (132.9 kg)  12/13/21 300 lb (136.1 kg)    General appearance: alert, no distress, well developed, well nourished Bilat lower legs with generalized lymphedema, unchanged, anterior left lower leg without any induration or tension of the skin as she had last time but there is still a faint darker  color in the front of the lower third of the leg and a somewhat light brown/red discoloration of the anterior medial left lower leg without palpable cord or nodule.  No warmth or erythema or induration or fluctuance Lower extremity pulses 1+ bilateral She is tender over the lower leg in general Otherwise knee and hip nontender with normal range of motion   Assessment: Encounter Diagnoses  Name Primary?   Lymphedema Yes   Pain of left lower extremity    Venous stasis       Plan: She is weaning off the cam walker per orthopedics She has started physical therapy and has seen some improvement with that.  Continue with therapy Advise she use stationary bike or elliptical machine that can help with the blood flow in her legs and get good exercise with this.  This will also help stretch the connective tissue in the legs  We discussed that she can use topical remedies such as Voltaren gel, or some the other creams over-the-counter such as capsaicin  Advise she continue ibuprofen without longer for least another week or so or as needed.  Begin trial of gabapentin once daily in the evening.  After 1 week can go to 2 capsules daily if desired if not improving  Of note she does not do well  with narcotic pain medicine, codeine, hydrocodone, or tremor.  No obvious sign of erythema nodosum, no obvious cellulitis  We discussed a very likely chance that she will start having some venous stasis dark coloration in her lower legs as time goes on.   No signs of DVT today.  Of note she has had recent imaging and x-rays of the leg   [name] was seen today for follow-up.  Diagnoses and all orders for this visit:  Lymphedema  Pain of left lower extremity  Venous stasis  Other orders -     gabapentin (NEURONTIN) 100 MG capsule; Take 1 capsule (100 mg total) by mouth at bedtime. After 1 week, can increase to 2 capsules at bedime -     ibuprofen (ADVIL) 800 MG tablet; Take 1 tablet (800 mg total) by  mouth in the morning and at bedtime.     Follow up: call report 2-3 weeks

## 2022-10-03 IMAGING — CT CT ENTEROGRAPHY (ABD-PELV W/ CM)
2 of 6 series · 16 of 46 positions shown, 18 images · IV contrast (APPLIED)
Comparison: None.

CLINICAL DATA: Iron deficiency anemia.  Elevated phosphatase level.

EXAM:
CT ABDOMEN AND PELVIS WITH CONTRAST (ENTEROGRAPHY)
TECHNIQUE: Multidetector CT of the abdomen and pelvis during bolus
administration of intravenous contrast. Negative oral contrast was
given.
CONTRAST:  100mL OMNIPAQUE IOHEXOL 300 MG/ML  SOLN

[Series 3: entero thins · axial · 0.90mm/px · z∈[+995,+1433]mm · 13 of 249 slices shown, 15 images]
[im 15/249  soft-tissue]
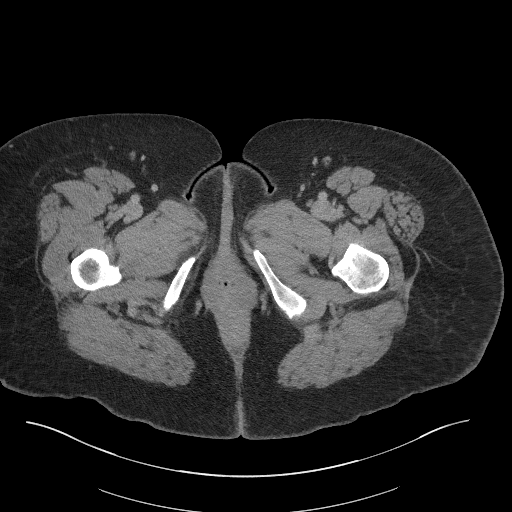
[im 15/249  bone]
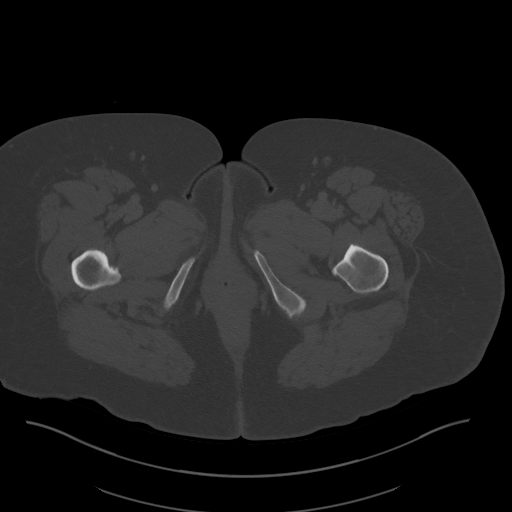
[im 30/249  soft-tissue]
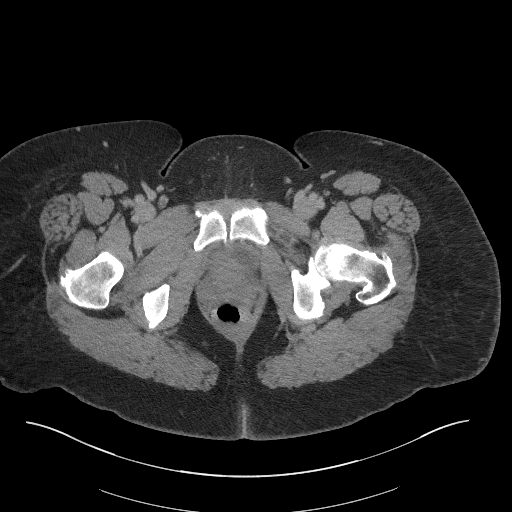
[im 59/249  soft-tissue]
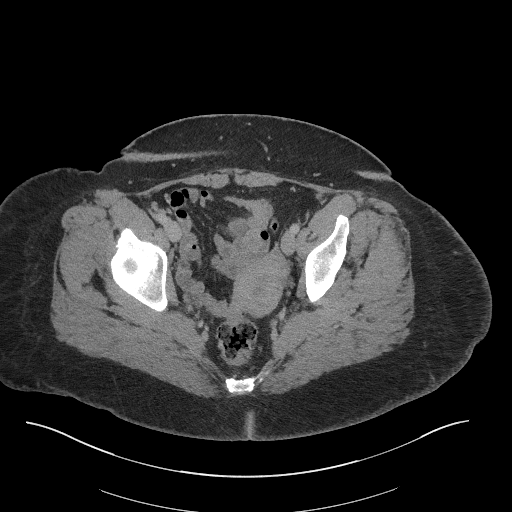
[im 73/249  soft-tissue]
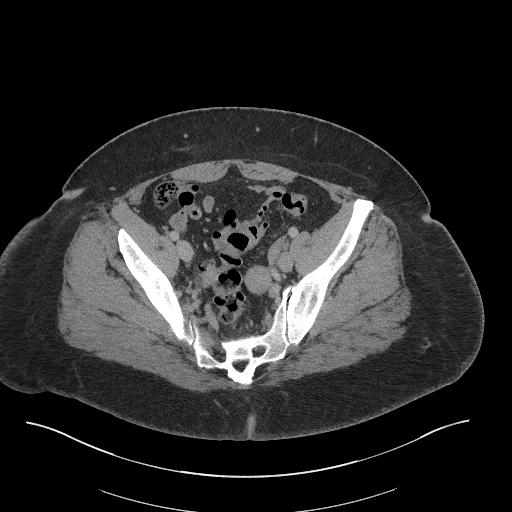
[im 88/249  soft-tissue]
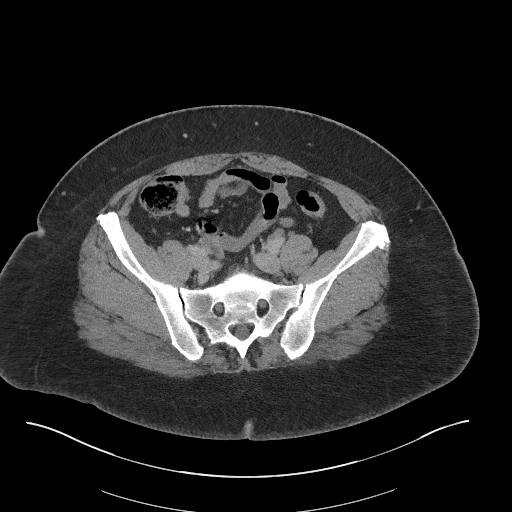
[im 103/249  soft-tissue]
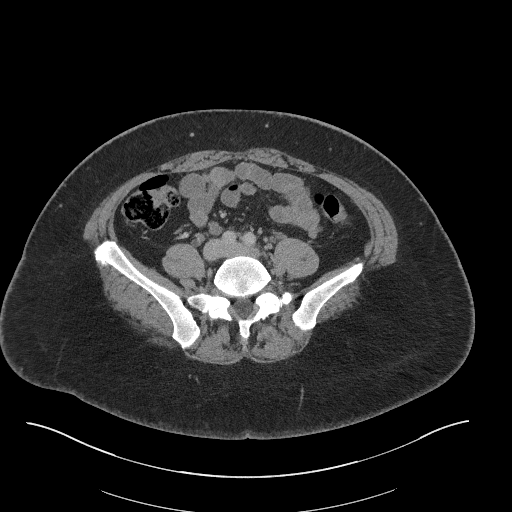
[im 132/249  soft-tissue]
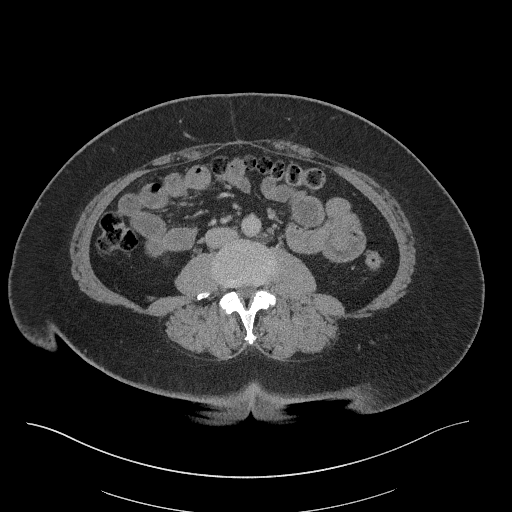
[im 146/249  soft-tissue]
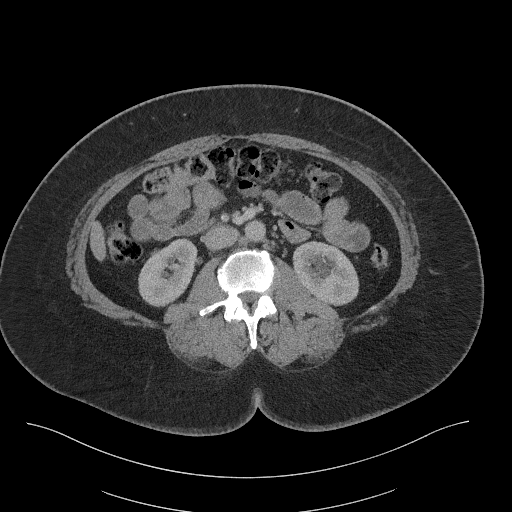
[im 161/249  soft-tissue]
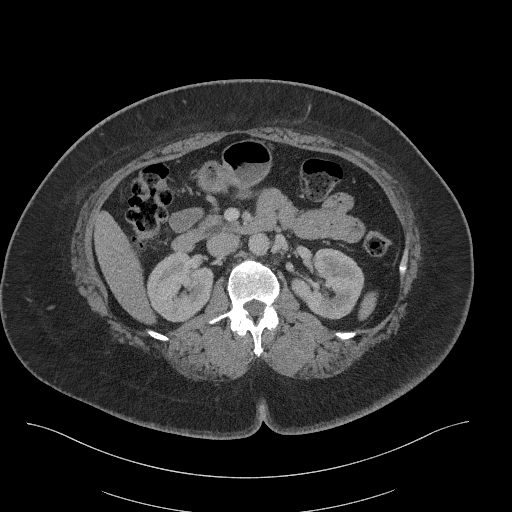
[im 161/249  bone]
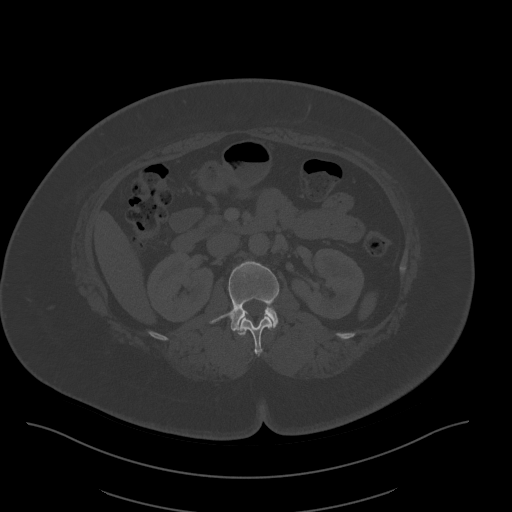
[im 176/249  soft-tissue]
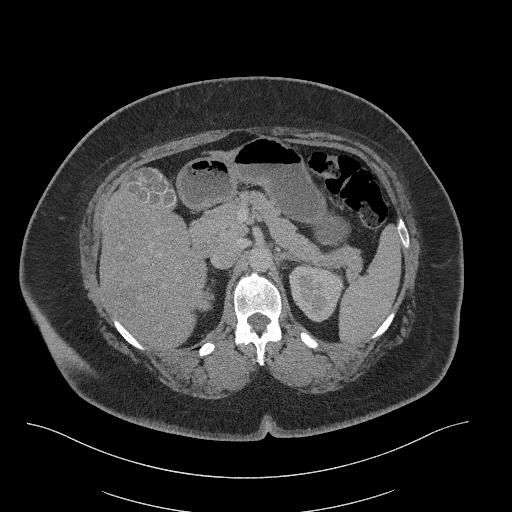
[im 190/249  soft-tissue]
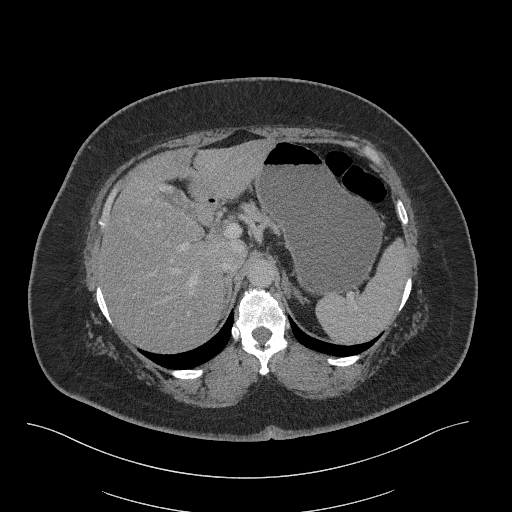
[im 219/249  soft-tissue]
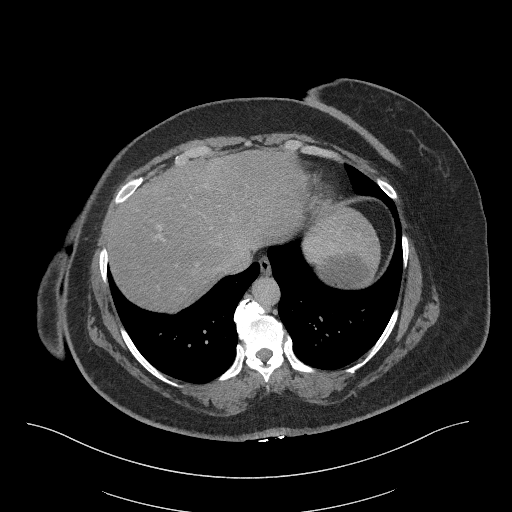
[im 234/249  soft-tissue]
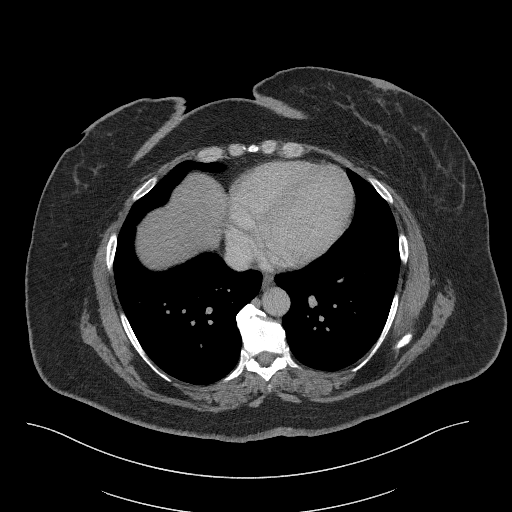

[Series 6: coronal · coronal · 0.94mm/px · 3 of 101 slices shown]
[im 34/101  soft-tissue]
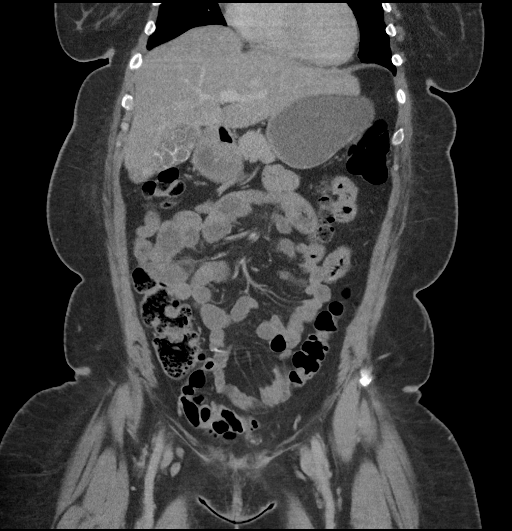
[im 45/101  soft-tissue]
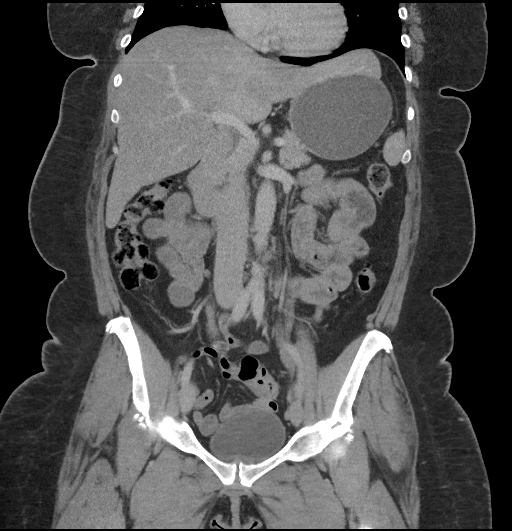
[im 56/101  soft-tissue]
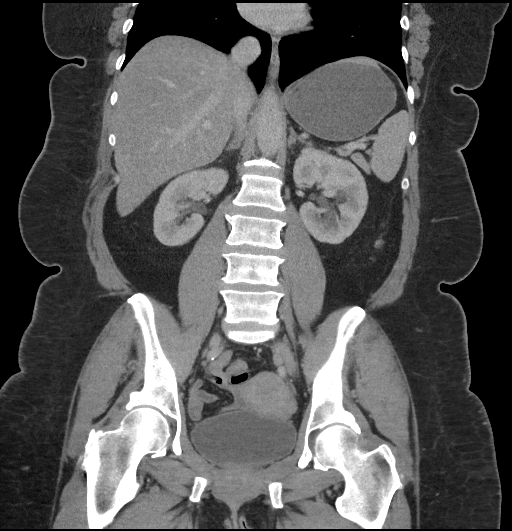

[16 of 46 positions shown; findings below may reference images not displayed]

FINDINGS: Lower Chest: 5 mm noncalcified pulmonary nodule is seen in the
lateral left lower lobe on image [DATE].

Hepatobiliary: No hepatic masses identified. Moderate diffuse
hepatic steatosis is seen. Numerous gallstones are seen filling the
gallbladder lumen, however there is no evidence of cholecystitis or
biliary ductal dilatation.

Pancreas:  No mass or inflammatory changes.

Spleen: Within normal limits in size and appearance.

Adrenals/Urinary Tract: No masses identified. Tiny sub-cm cyst noted
in lower pole of right kidney. No evidence of ureteral calculi or
hydronephrosis. Unremarkable unopacified urinary bladder.

Stomach/Bowel: No evidence of bowel wall thickening, abnormal
contrast enhancement, or dilatation. No evidence of mesenteric
inflammatory changes, enteric fistula, or abnormal fluid
collections. The terminal ileum is normal in appearance. Normal
appendix visualized.

Vascular/Lymphatic: No pathologically enlarged lymph nodes. No
abdominal aortic aneurysm.

Reproductive:  No mass or other significant abnormality.

Other:  None.

Musculoskeletal:  No suspicious bone lesions identified.
IMPRESSION: No radiographic evidence of inflammatory bowel disease or other
enteric pathology.

Moderate hepatic steatosis.

Cholelithiasis. No radiographic evidence of cholecystitis or biliary
ductal dilatation.

5 mm indeterminate left lower lobe pulmonary nodule. No follow-up
needed if patient is low-risk. Non-contrast chest CT can be
considered in 12 months if patient is high-risk. This recommendation
follows the consensus statement: Guidelines for Management of
Incidental Pulmonary Nodules Detected on CT Images: From the

## 2022-10-20 ENCOUNTER — Emergency Department (HOSPITAL_BASED_OUTPATIENT_CLINIC_OR_DEPARTMENT_OTHER): Payer: 59

## 2022-10-20 ENCOUNTER — Other Ambulatory Visit: Payer: Self-pay

## 2022-10-20 ENCOUNTER — Emergency Department (HOSPITAL_COMMUNITY)
Admission: EM | Admit: 2022-10-20 | Discharge: 2022-10-20 | Disposition: A | Discharge status: Home / Self Care | Payer: 59 | Attending: Emergency Medicine | Admitting: Emergency Medicine

## 2022-10-20 DIAGNOSIS — I10 Essential (primary) hypertension: Secondary | ICD-10-CM | POA: Diagnosis not present

## 2022-10-20 DIAGNOSIS — Z79899 Other long term (current) drug therapy: Secondary | ICD-10-CM | POA: Insufficient documentation

## 2022-10-20 DIAGNOSIS — M79662 Pain in left lower leg: Secondary | ICD-10-CM | POA: Diagnosis present

## 2022-10-20 DIAGNOSIS — D509 Iron deficiency anemia, unspecified: Secondary | ICD-10-CM | POA: Insufficient documentation

## 2022-10-20 DIAGNOSIS — M7989 Other specified soft tissue disorders: Secondary | ICD-10-CM | POA: Diagnosis not present

## 2022-10-20 DIAGNOSIS — T502X2A Poisoning by carbonic-anhydrase inhibitors, benzothiadiazides and other diuretics, intentional self-harm, initial encounter: Secondary | ICD-10-CM | POA: Insufficient documentation

## 2022-10-20 DIAGNOSIS — E876 Hypokalemia: Secondary | ICD-10-CM | POA: Insufficient documentation

## 2022-10-20 DIAGNOSIS — I890 Lymphedema, not elsewhere classified: Secondary | ICD-10-CM | POA: Insufficient documentation

## 2022-10-20 LAB — COMPREHENSIVE METABOLIC PANEL
ALT: 16 U/L (ref 0–44)
AST: 20 U/L (ref 15–41)
Albumin: 4.2 g/dL (ref 3.5–5.0)
Alkaline Phosphatase: 144 U/L — ABNORMAL HIGH (ref 38–126)
Anion gap: 12 (ref 5–15)
BUN: 17 mg/dL (ref 8–23)
CO2: 27 mmol/L (ref 22–32)
Calcium: 9.7 mg/dL (ref 8.9–10.3)
Chloride: 99 mmol/L (ref 98–111)
Creatinine, Ser: 1.07 mg/dL — ABNORMAL HIGH (ref 0.44–1.00)
GFR, Estimated: 58 mL/min — ABNORMAL LOW (ref >60)
Glucose, Bld: 102 mg/dL — ABNORMAL HIGH (ref 70–99)
Potassium: 2.9 mmol/L — ABNORMAL LOW (ref 3.5–5.1)
Sodium: 138 mmol/L (ref 135–145)
Total Bilirubin: 0.7 mg/dL (ref 0.3–1.2)
Total Protein: 8.4 g/dL — ABNORMAL HIGH (ref 6.5–8.1)

## 2022-10-20 LAB — CBC WITH DIFFERENTIAL/PLATELET
Abs Immature Granulocytes: 0.05 10*3/uL (ref 0.00–0.07)
Basophils Absolute: 0.1 10*3/uL (ref 0.0–0.1)
Basophils Relative: 1 %
Eosinophils Absolute: 0.3 10*3/uL (ref 0.0–0.5)
Eosinophils Relative: 3 %
HCT: 37.5 % (ref 36.0–46.0)
Hemoglobin: 11.4 g/dL — ABNORMAL LOW (ref 12.0–15.0)
Immature Granulocytes: 1 %
Lymphocytes Relative: 28 %
Lymphs Abs: 2.8 10*3/uL (ref 0.7–4.0)
MCH: 24.2 pg — ABNORMAL LOW (ref 26.0–34.0)
MCHC: 30.4 g/dL (ref 30.0–36.0)
MCV: 79.4 fL — ABNORMAL LOW (ref 80.0–100.0)
Monocytes Absolute: 0.7 10*3/uL (ref 0.1–1.0)
Monocytes Relative: 7 %
Neutro Abs: 6.2 10*3/uL (ref 1.7–7.7)
Neutrophils Relative %: 60 %
Platelets: 284 10*3/uL (ref 150–400)
RBC: 4.72 MIL/uL (ref 3.87–5.11)
RDW: 15.2 % (ref 11.5–15.5)
WBC: 10 10*3/uL (ref 4.0–10.5)
nRBC: 0 % (ref 0.0–0.2)

## 2022-10-20 LAB — MAGNESIUM: Magnesium: 2 mg/dL (ref 1.7–2.4)

## 2022-10-20 MED ORDER — POTASSIUM CHLORIDE CRYS ER 20 MEQ PO TBCR
40.0000 meq | EXTENDED_RELEASE_TABLET | Freq: Once | ORAL | Status: AC
  Administered 2022-10-20: 40 meq via ORAL
  Filled 2022-10-20: qty 2

## 2022-10-20 MED ORDER — POTASSIUM CHLORIDE 10 MEQ/100ML IV SOLN
10.0000 meq | Freq: Once | INTRAVENOUS | Status: AC
  Administered 2022-10-20: 10 meq via INTRAVENOUS
  Filled 2022-10-20: qty 100

## 2022-10-20 MED ORDER — POTASSIUM CHLORIDE CRYS ER 20 MEQ PO TBCR: EXTENDED_RELEASE_TABLET | ORAL | 0 refills | Status: DC

## 2022-10-20 MED ORDER — HYDROCODONE-ACETAMINOPHEN 5-325 MG PO TABS: 1.0000 | ORAL_TABLET | ORAL | 0 refills | Status: DC | PRN

## 2022-10-20 NOTE — Discharge Instructions (Addendum)
Please work with your primary care provider regarding your pain control.  Consider asking for referral to a pain management clinic.  Your DVT study here again was negative.  2 studies looking for this were both negative which makes it very unlikely that this is due to a blood clot.  Certainly no test is perfect and if your doctor wants to check a third time they could consider it.  Please follow-up with them in the office.

## 2022-10-20 NOTE — ED Notes (Signed)
Pt in bed, pt states that she is ready to go home, d/c pt iv, cath intact, 22 gauge removed from her R ac, pt verbalized understanding d/c and follow up, reviewed prescriptions, pt ambulatory from dpt.

## 2022-10-20 NOTE — ED Provider Notes (Signed)
[facility] EMERGENCY DEPARTMENT AT [facility] Provider Note   CSN: [id number] Arrival date & time: 10/20/22  8469     History  Chief Complaint  Patient presents with   Leg Pain     Gina Lopez is a 67 y.o. female.  The history is provided by the patient.  She has history of hypertension, lymphedema and has been having left calf pain for about the last 3-4 months.  Pain has been worse today.  It is worse when she stands but is okay when she is laying down.  She has chronic leg swelling which is worse on the left which she relates to having had been stripping done on the left in the past.  She denies chest pain, heaviness, tightness, pressure.  She denies dyspnea.  She had been taking tramadol for pain without relief.  Her primary care provider has started her on low-dose gabapentin which has not given her any relief.  She is currently taking 200 mg of gabapentin daily.   Home Medications Prior to Admission medications   Medication Sig Start Date End Date Taking? Authorizing Provider  amLODipine (NORVASC) 10 MG tablet Take 1 tablet (10 mg total) by mouth daily. 10/27/21   Tysinger, Kermit Balo, PA-C  atenolol (TENORMIN) 25 MG tablet Take 1 tablet (25 mg total) by mouth daily. 10/27/21   Tysinger, Kermit Balo, PA-C  chlorthalidone (HYGROTON) 25 MG tablet Take 0.5 tablets (12.5 mg total) by mouth daily. 10/31/21   Tysinger, Kermit Balo, PA-C  Cholecalciferol (VITAMIN D) 50 MCG (2000 UT) CAPS Take 1 capsule (2,000 Units total) by mouth daily. 10/31/21   Tysinger, Kermit Balo, PA-C  doxycycline (ADOXA) 100 MG tablet Take 100 mg by mouth 2 (two) times daily. 08/21/22   [provider]  gabapentin (NEURONTIN) 100 MG capsule Take 1 capsule (100 mg total) by mouth at bedtime. After 1 week, can increase to 2 capsules at bedime 09/27/22   Tysinger, Kermit Balo, PA-C  ibuprofen (ADVIL) 800 MG tablet Take 1 tablet (800 mg total) by mouth in the morning and at bedtime. 09/27/22   Tysinger, Kermit Balo, PA-C   iron polysaccharides (FERREX 150) 150 MG capsule Take 1 capsule (150 mg total) by mouth 2 (two) times daily. 09/03/22   Tysinger, Kermit Balo, PA-C  vitamin B-12 (CYANOCOBALAMIN) 100 MCG tablet Take 100 mcg by mouth daily. Patient not taking: Reported on 09/13/2022    [provider]      Allergies    Codeine, Terbinafine and related, and Tramadol    Review of Systems   Review of Systems  All other systems reviewed and are negative.   Physical Exam Updated Vital Signs BP (!) 152/58 (BP Location: Left Arm)   Pulse 88   Temp 98.3 F (36.8 C) (Oral)   Resp 17   SpO2 99%  Physical Exam Vitals and nursing note reviewed.   67 year old female, resting comfortably and in no acute distress. Vital signs are significant for elevated blood pressure. Oxygen saturation is 99%, which is normal. Head is normocephalic and atraumatic. PERRLA, EOMI. Oropharynx is clear. Neck is nontender and supple without adenopathy or JVD. Back is nontender and there is no CVA tenderness. Lungs are clear without rales, wheezes, or rhonchi. Chest is nontender. Heart has regular rate and rhythm without murmur. Abdomen is soft, flat, nontender. Extremities have 3+ edema, full range of motion is present.  There is no tenderness to palpation, Denna Haggard' sign is negative. Skin is warm and  dry without rash. Neurologic: Mental status is normal, cranial nerves are intact, moves all extremities equally.  ED Results / Procedures / Treatments   Labs (all labs ordered are listed, but only abnormal results are displayed) Labs Reviewed  CBC WITH DIFFERENTIAL/PLATELET  COMPREHENSIVE METABOLIC PANEL    EKG None  Radiology No results found.  Procedures Procedures    Medications Ordered in ED Medications - No data to display  ED Course/ Medical Decision Making/ A&P                             Medical Decision Making Amount and/or Complexity of Data Reviewed Labs: ordered.  Risk Prescription drug  management.   Subacute to chronic left leg pain of uncertain cause.  I have reviewed her past records and she was seen in the ED on 08/13/2022 at which time venous ultrasound showed no evidence of DVT and she was treated for cellulitis.  She has several follow-up visits with her primary care provider but the notes are not visible in epic.  At this point, the cause of her pain is not clear.  I do not believe it is from lymphedema and she has no evidence of cellulitis.  Possible chronic DVT.  Prior venous ultrasound is negative, but venous Doppler is a better study to look for DVT.  I have ordered that.  I have reviewed and interpreted her laboratory test, and my interpretation is moderate to severe hypokalemia, borderline elevated random glucose level, elevated alkaline phosphatase, chronic anemia.  I have ordered a magnesium level as well as oral and intravenous potassium.  I have ordered vascular venous ultrasound.  Because of the severity of hypokalemia, I ordered a magnesium level which is normal.  I vascular ultrasound is pending.  Case is signed out to Dr. Adela Lank.  If negative, she will be discharged with prescriptions for potassium as well as a small number of hydrocodone-acetaminophen tablets for pain control.  Final Clinical Impression(s) / ED Diagnoses Final diagnoses:  Pain of left calf  Lymphedema  Diuretic-induced hypokalemia  Microcytic anemia    Rx / DC Orders ED Discharge Orders          Ordered    potassium chloride SA (KLOR-CON M) 20 MEQ tablet        10/20/22 0706    HYDROcodone-acetaminophen (NORCO) 5-325 MG tablet  Every 4 hours PRN        10/20/22 0706              Dione Booze, MD 10/20/22 [phone]

## 2022-10-20 NOTE — Progress Notes (Signed)
VASCULAR LAB    Left venous duplex has been performed.  See CV proc for preliminary results.  Messaged negative results to Dr. Adela Lank via secure chat.  Josetta Wigal, RVT 10/20/2022, 8:31 AM

## 2022-10-20 NOTE — ED Triage Notes (Signed)
Patient reports chronic left lower leg pain with swelling for several months , denies injury or fall , patient stated pain worse these past several days .

## 2022-10-20 NOTE — ED Provider Notes (Signed)
Received patient in turnover from Dr. Preston Fleeting.  Please see their note for further details of Hx, PE.  Briefly patient is a 67 y.o. female with a Leg Pain  .  Awaiting DVT study despite having 1 done recently that was negative.  DVT study here today is also negative.  Will discharge home.  PCP follow-up.Adela Lank, Jesusita Oka, DO 10/20/22 [phone]

## 2022-10-23 ENCOUNTER — Ambulatory Visit: Payer: 59 | Admitting: Medical

## 2022-10-23 ENCOUNTER — Encounter: Payer: Self-pay | Admitting: Medical

## 2022-10-23 VITALS — BP 120/70 | HR 60 | Wt 298.6 lb

## 2022-10-23 DIAGNOSIS — I890 Lymphedema, not elsewhere classified: Secondary | ICD-10-CM | POA: Diagnosis not present

## 2022-10-23 DIAGNOSIS — M79671 Pain in right foot: Secondary | ICD-10-CM | POA: Diagnosis not present

## 2022-10-23 DIAGNOSIS — M79604 Pain in right leg: Secondary | ICD-10-CM

## 2022-10-23 DIAGNOSIS — G8929 Other chronic pain: Secondary | ICD-10-CM

## 2022-10-23 DIAGNOSIS — M79605 Pain in left leg: Secondary | ICD-10-CM

## 2022-10-23 DIAGNOSIS — I10 Essential (primary) hypertension: Secondary | ICD-10-CM | POA: Diagnosis not present

## 2022-10-23 DIAGNOSIS — Z1231 Encounter for screening mammogram for malignant neoplasm of breast: Secondary | ICD-10-CM

## 2022-10-23 DIAGNOSIS — E876 Hypokalemia: Secondary | ICD-10-CM

## 2022-10-23 DIAGNOSIS — M79672 Pain in left foot: Secondary | ICD-10-CM

## 2022-10-23 DIAGNOSIS — I878 Other specified disorders of veins: Secondary | ICD-10-CM | POA: Insufficient documentation

## 2022-10-23 MED ORDER — GABAPENTIN 100 MG PO CAPS
200.0000 mg | ORAL_CAPSULE | Freq: Two times a day (BID) | ORAL | 1 refills | Status: DC
Start: 2022-10-23 — End: 2022-12-14

## 2022-10-23 MED ORDER — CHLORTHALIDONE 25 MG PO TABS: 12.5000 mg | ORAL_TABLET | Freq: Every day | ORAL | 3 refills | Status: DC

## 2022-10-23 NOTE — Patient Instructions (Signed)
Recommendations: Change back to Chlorthalidone 25mg , 1/2 tablet daily instead of a whole tablet daily By lowering the chlorthalidone, you can go back to once daily on the potassium, but use or 1/2 tablet of the dose daily Change Gabapentin pain medicaiton dose to 200mg  or 2 capsule twice daily for the next few weeks and see how you tolerate this You can use the Hydrocodone pain medicaiton front he emergency dept, 1/2-1 tablet up to twice daily for worse pain Continue your other medicaiton as usual

## 2022-10-23 NOTE — Progress Notes (Addendum)
Subjective:  Gina Lopez is a 67 y.o. female who presents for Chief Complaint  Patient presents with   leg pain    Leg pain, getting worse and not better.      Here for recheck on legs.  Last visit here with me she started gabapentin to help with pain control, she continued with physical therapy, home exercises, stretching.  She saw some improvement but then this past week she had 1 day of the jewelry leg really was hurting bad.  She went to emergency department.  They did a repeat ultrasound to rule out DVT.  Her potassium is low but she has been taking the higher dose of chlorthalidone 25 mg instead of half tablet daily.  Potassium was increased temporarily.  She is still having quite a bit of pain and does not think she can work the next few days.  She works at the [employer].  She works 12-hour days.  She is having use a cane currently.  Doing physical therapy, has 2 water therapy days and 2 land therapy days left.  I saw her recently on 09/27/2022 and 09/13/2022 for the same.  About a month ago she was using CAM Walker boot, has started physical therapy and as of last visit orthopedics discontinued the cam walker and advise she wean off of this.  Had xrays of tib /fib at ED on 10/20/22 and recently with ortho.  No recent foot xray.  Leg mainly hurts in front of lower leg.   No other aggravating or relieving factors.    No other c/o.  Past Medical History:  Diagnosis Date   Allergy    environmental in spring   Anemia    Arthritis    knees   Colon polyp 07/2015   tubular adenoma, Dr. Adela Lank   Heart murmur    benign, 2013 echo   History of echocardiogram 02/22/12   mild concentric LVH, EF 55%, left atrial mildly dilated, Dr. Rennis Golden   Hypertension    Lymphedema of leg    L>R, sees vein clinic, uses compression pump device at home daily; [facility] Clinic   Obesity    Wears partial dentures    upper only   Current Outpatient Medications on File Prior to Visit  Medication Sig Dispense  Refill   amLODipine (NORVASC) 10 MG tablet Take 1 tablet (10 mg total) by mouth daily. 90 tablet 3   atenolol (TENORMIN) 25 MG tablet Take 1 tablet (25 mg total) by mouth daily. 90 tablet 3   Cholecalciferol (VITAMIN D) 50 MCG (2000 UT) CAPS Take 1 capsule (2,000 Units total) by mouth daily. 90 capsule 3   HYDROcodone-acetaminophen (NORCO) 5-325 MG tablet Take 1 tablet by mouth every 4 (four) hours as needed for moderate pain. 10 tablet 0   ibuprofen (ADVIL) 800 MG tablet Take 1 tablet (800 mg total) by mouth in the morning and at bedtime. 30 tablet 0   iron polysaccharides (FERREX 150) 150 MG capsule Take 1 capsule (150 mg total) by mouth 2 (two) times daily. 180 capsule 0   potassium chloride SA (KLOR-CON M) 20 MEQ tablet Take 1 tablet twice a day for the next 10 days, then take 1 tablet every day 40 tablet 0   vitamin B-12 (CYANOCOBALAMIN) 100 MCG tablet Take 100 mcg by mouth daily.     No current facility-administered medications on file prior to visit.   Past Surgical History:  Procedure Laterality Date   COLONOSCOPY     age 73yo  due to blood in stool, [place]   COLONOSCOPY  07/2015   tubular adenoma polyp, repeat 5 years, Dr. Adela Lank   CYST EXCISION  01/14/15   finger   FINGER SURGERY     reattachment of tip of pinky finger on left hand   POLYPECTOMY     VEIN LIGATION AND STRIPPING     [facility]     The following portions of the patient's history were reviewed and updated as appropriate: allergies, current medications, past family history, past medical history, past social history, past surgical history and problem list.  ROS Otherwise as in subjective above  Objective: BP 120/70   Pulse 60   Wt 298 lb 9.6 oz (135.4 kg)   BMI 45.40 kg/m   Wt Readings from Last 3 Encounters:  10/23/22 298 lb 9.6 oz (135.4 kg)  09/27/22 (!) 302 lb (137 kg)  09/13/22 293 lb (132.9 kg)    General appearance: alert, no distress, well developed, well nourished Bilat  lower legs with generalized lymphedema, unchanged, anterior left lower leg without any induration or tension of the skin as she had last time but there is still a faint darker color in the front of the lower third of the leg and a somewhat light brown/red discoloration of the anterior medial left lower leg without palpable cord or nodule.  No warmth or erythema or induration or fluctuance Lower extremity pulses 1+ bilateral She is tender over the lower leg in general Otherwise knee and hip nontender with normal range of motion   Assessment: Encounter Diagnoses  Name Primary?   Chronic pain of both lower extremities Yes   Essential hypertension    Lymphedema    Pain in both feet    Hypokalemia    Venous stasis    Encounter for screening mammogram for malignant neoplasm of breast        Plan: I reviewed her recent 10/20/2022 emergency department visit, labs, x-ray and ultrasound.  She had hypokalemia which was corrected.  Hypertension Decrease back down to 1/2 tablet daily or 12.5 mg daily chlorthalidone Continue amlodipine 10 mg daily Continue atenolol 25 mg daily  Hypokalemia Since we are lowering back down on the chlorthalidone, go ahead and go back down to 10 mEq of potassium daily  Chronic pain of legs, lymphedema, venous stasis Increase to gabapentin 200 mg twice daily Continue physical therapy Continue home exercises and stretching She can use the hydrocodone pain medicine as needed short-term for worse pain Can use ibuprofen as needed   Patient Instructions  Recommendations: Change back to Chlorthalidone 25mg , 1/2 tablet daily instead of a whole tablet daily By lowering the chlorthalidone, you can go back to once daily on the potassium, but use or 1/2 tablet of the dose daily Change Gabapentin pain medicaiton dose to 200mg  or 2 capsule twice daily for the next few weeks and see how you tolerate this You can use the Hydrocodone pain medicaiton front he  emergency dept, 1/2-1 tablet up to twice daily for worse pain Continue your other medicaiton as usual      [place] was seen today for leg pain.  Diagnoses and all orders for this visit:  Chronic pain of both lower extremities  Essential hypertension -     chlorthalidone (HYGROTON) 25 MG tablet; Take 0.5 tablets (12.5 mg total) by mouth daily.  Lymphedema  Pain in both feet  Hypokalemia  Venous stasis  Encounter for screening mammogram for malignant neoplasm of breast -  MM DIGITAL SCREENING BILATERAL; Future  Other orders -     gabapentin (NEURONTIN) 100 MG capsule; Take 2 capsules (200 mg total) by mouth 2 (two) times daily.    Follow up: call report 2-3 weeks

## 2022-10-23 NOTE — Addendum Note (Signed)
Addended by: Jac Canavan on: 10/23/2022 09:07 AM   Modules accepted: Orders

## 2022-10-29 ENCOUNTER — Encounter (HOSPITAL_COMMUNITY): Payer: Self-pay

## 2022-10-29 ENCOUNTER — Ambulatory Visit (HOSPITAL_COMMUNITY)
Admission: EM | Admit: 2022-10-29 | Discharge: 2022-10-29 | Disposition: A | Discharge status: Home / Self Care | Payer: 59 | Attending: Urgent Care | Admitting: Urgent Care

## 2022-10-29 DIAGNOSIS — H1012 Acute atopic conjunctivitis, left eye: Secondary | ICD-10-CM | POA: Diagnosis not present

## 2022-10-29 DIAGNOSIS — J309 Allergic rhinitis, unspecified: Secondary | ICD-10-CM | POA: Diagnosis not present

## 2022-10-29 MED ORDER — FLUTICASONE PROPIONATE 50 MCG/ACT NA SUSP: 1.0000 | Freq: Every day | NASAL | 0 refills | Status: DC

## 2022-10-29 MED ORDER — PAZEO 0.7 % OP SOLN: 1.0000 [drp] | OPHTHALMIC | 0 refills | Status: DC

## 2022-10-29 NOTE — ED Provider Notes (Signed)
[facility]    CSN: [id number] Arrival date & time: 10/29/22  4696      History   Chief Complaint Chief Complaint  Patient presents with   Eye Drainage    HPI Gina Lopez is a 67 y.o. female.   67 year old female presents today due to concerns of eye irritation and tearing on her left eye starting yesterday while at church.  She denies any foreign body or injury to her eye.  She does not wear contacts.  She does have a known history of allergies.  She states that her eye is slightly red and irritated, itching, and tearing.  She denies any mucopurulent discharge.  She states her vision feels blurred because of the tears, but otherwise no change in vision.  She denies photophobia or pain with extraocular movements.  She denies any swelling or redness to her eyelids.  She denies a fever or headache.  No stiffness in her neck.  She does report some nasal congestion and sneezing on the left.      Past Medical History:  Diagnosis Date   Allergy    environmental in spring   Anemia    Arthritis    knees   Colon polyp 07/2015   tubular adenoma, Dr. Adela Lank   Heart murmur    benign, 2013 echo   History of echocardiogram 02/22/12   mild concentric LVH, EF 55%, left atrial mildly dilated, Dr. Rennis Golden   Hypertension    Lymphedema of leg    L>R, sees vein clinic, uses compression pump device at home daily; [facility] Clinic   Obesity    Wears partial dentures    upper only    Patient Active Problem List   Diagnosis Date Noted   Chronic pain of both lower extremities 10/23/2022   Venous stasis 10/23/2022   Hypokalemia 12/04/2021   Encounter for screening mammogram for malignant neoplasm of breast 10/27/2021   Postmenopausal estrogen deficiency 10/27/2021   Screening for lipid disorders 10/27/2021   Hepatic steatosis 10/27/2021   Vitamin D deficiency 08/10/2021   Impaired fasting blood sugar 08/10/2021   Need for influenza vaccination 08/09/2020   Pain in both feet  08/09/2020   Post-menopausal 08/09/2020   Estrogen deficiency 08/09/2020   Flat feet 08/09/2020   Plantar fasciitis 08/09/2020   Tendonitis, Achilles, left 05/30/2020   Multinodular goiter 01/26/2020   Allergic rhinitis due to pollen 09/04/2019   Leg cramp 09/04/2019   Digital mucinous cyst of finger of right hand 05/28/2017   Vaccine counseling 05/28/2017   Iron deficiency anemia 03/27/2016   Lymphedema 05/18/2015   Encounter for health maintenance examination in adult 05/18/2015   Obesity 05/18/2015   Screening for diabetes mellitus 05/18/2015   Screening for cervical cancer 05/18/2015   Need for prophylactic vaccination and inoculation against influenza 05/18/2015   Essential hypertension 04/29/2015   LVH (left ventricular hypertrophy) 04/29/2015   Heart murmur 01/09/2012    Past Surgical History:  Procedure Laterality Date   COLONOSCOPY     age 86yo due to blood in stool, [place]   COLONOSCOPY  07/2015   tubular adenoma polyp, repeat 5 years, Dr. Adela Lank   CYST EXCISION  01/14/15   finger   FINGER SURGERY     reattachment of tip of pinky finger on left hand   POLYPECTOMY     VEIN LIGATION AND STRIPPING     [facility]    OB History   No obstetric history on file.  Home Medications    Prior to Admission medications   Medication Sig Start Date End Date Taking? Authorizing Provider  fluticasone (FLONASE) 50 MCG/ACT nasal spray Place 1 spray into both nostrils daily. 10/29/22  Yes Alaja Goldinger L, PA  Olopatadine HCl (PAZEO) 0.7 % SOLN Apply 1 drop to eye 1 day or 1 dose. 10/29/22  Yes Najeeb Uptain L, PA  amLODipine (NORVASC) 10 MG tablet Take 1 tablet (10 mg total) by mouth daily. 10/27/21   Tysinger, Kermit Balo, PA-C  atenolol (TENORMIN) 25 MG tablet Take 1 tablet (25 mg total) by mouth daily. 10/27/21   Tysinger, Kermit Balo, PA-C  chlorthalidone (HYGROTON) 25 MG tablet Take 0.5 tablets (12.5 mg total) by mouth daily. 10/23/22   Tysinger, Kermit Balo, PA-C   Cholecalciferol (VITAMIN D) 50 MCG (2000 UT) CAPS Take 1 capsule (2,000 Units total) by mouth daily. 10/31/21   Tysinger, Kermit Balo, PA-C  gabapentin (NEURONTIN) 100 MG capsule Take 2 capsules (200 mg total) by mouth 2 (two) times daily. 10/23/22   Tysinger, Kermit Balo, PA-C  HYDROcodone-acetaminophen (NORCO) 5-325 MG tablet Take 1 tablet by mouth every 4 (four) hours as needed for moderate pain. 10/20/22   Dione Booze, MD  ibuprofen (ADVIL) 800 MG tablet Take 1 tablet (800 mg total) by mouth in the morning and at bedtime. 09/27/22   Tysinger, Kermit Balo, PA-C  iron polysaccharides (FERREX 150) 150 MG capsule Take 1 capsule (150 mg total) by mouth 2 (two) times daily. 09/03/22   Tysinger, Kermit Balo, PA-C  potassium chloride SA (KLOR-CON M) 20 MEQ tablet Take 1 tablet twice a day for the next 10 days, then take 1 tablet every day 10/20/22   Dione Booze, MD  vitamin B-12 (CYANOCOBALAMIN) 100 MCG tablet Take 100 mcg by mouth daily.    [provider]    Family History Family History  Problem Relation Age of Onset   Arthritis Mother    Hypertension Mother    Obesity Mother    Hip fracture Mother    Kidney disease Mother    Alcohol abuse Father    Cancer Sister        type unknown   Diabetes Brother    Heart disease Neg Hx    Stroke Neg Hx    Colon cancer Neg Hx    Stomach cancer Neg Hx    Rectal cancer Neg Hx    Colon polyps Neg Hx    Breast cancer Neg Hx     Social History Social History   Tobacco Use   Smoking status: Former    Packs/day: 0.25    Years: 1.00    Additional pack years: 0.00    Total pack years: 0.25    Types: Cigarettes    Quit date: 1983    Years since quitting: 41.4   Smokeless tobacco: Never  Vaping Use   Vaping Use: Never used  Substance Use Topics   Alcohol use: Yes    Comment: rare   Drug use: No     Allergies   Codeine, Terbinafine and related, and Tramadol   Review of Systems Review of Systems As per HPI  Physical Exam Triage Vital  Signs ED Triage Vitals  Enc Vitals Group     BP      Pulse      Resp      Temp      Temp src      SpO2      Weight  Height      Head Circumference      Peak Flow      Pain Score      Pain Loc      Pain Edu?      Excl. in GC?    No data found.  Updated Vital Signs BP (!) 170/77 (BP Location: Left Arm)   Pulse 68   Temp 98.3 F (36.8 C)   Resp 18   SpO2 98%   Visual Acuity Right Eye Distance: 20/50 Left Eye Distance: 20/40 Bilateral Distance: 20/40  Right Eye Near:   Left Eye Near:    Bilateral Near:     Physical Exam Vitals and nursing note reviewed.  Constitutional:      General: She is not in acute distress.    Appearance: Normal appearance. She is normal weight. She is not ill-appearing, toxic-appearing or diaphoretic.  HENT:     Head: Normocephalic and atraumatic.     Right Ear: Tympanic membrane, ear canal and external ear normal. There is no impacted cerumen.     Left Ear: Tympanic membrane, ear canal and external ear normal. There is no impacted cerumen.     Nose: Congestion and rhinorrhea present.     Mouth/Throat:     Mouth: Mucous membranes are moist.     Pharynx: Oropharynx is clear. No oropharyngeal exudate or posterior oropharyngeal erythema.  Eyes:     General: Lids are normal. Lids are everted, no foreign bodies appreciated. Vision grossly intact. Gaze aligned appropriately. No allergic shiner, visual field deficit or scleral icterus.       Right eye: No foreign body, discharge or hordeolum.        Left eye: No foreign body, discharge or hordeolum.     Extraocular Movements: Extraocular movements intact.     Right eye: Normal extraocular motion.     Left eye: Normal extraocular motion.     Conjunctiva/sclera:     Right eye: Right conjunctiva is not injected. No chemosis, exudate or hemorrhage.    Left eye: Left conjunctiva is injected (minimally; negative ciliary flush; slight tearin). No chemosis, exudate or hemorrhage.    Pupils: Pupils  are equal, round, and reactive to light.     Right eye: Pupil is round and reactive.     Left eye: Pupil is round and reactive.  Neurological:     Mental Status: She is alert.      UC Treatments / Results  Labs (all labs ordered are listed, but only abnormal results are displayed) Labs Reviewed - No data to display  EKG   Radiology No results found.  Procedures Procedures (including critical care time)  Medications Ordered in UC Medications - No data to display  Initial Impression / Assessment and Plan / UC Course  I have reviewed the triage vital signs and the nursing notes.  Pertinent labs & imaging results that were available during my care of the patient were reviewed by me and considered in my medical decision making (see chart for details).     Allergic conjunctivitis and rhinitis - will start pazeo eye drops and flonase nasal spray. ER precautions reviewed  Final Clinical Impressions(s) / UC Diagnoses   Final diagnoses:  Allergic conjunctivitis and rhinitis, left     Discharge Instructions      Please read the attached handout regarding your condition. Please use 1 drop every 24 hours to the affected eye.  Please also use 1 spray each nostril twice daily.  Please go  home and take your blood pressure medication.  Please monitor ensure normalcy of blood pressure.  If you develop any eye pain, change in vision, fever, or swelling, please head to the emergency room.     ED Prescriptions     Medication Sig Dispense Auth. Provider   Olopatadine HCl (PAZEO) 0.7 % SOLN Apply 1 drop to eye 1 day or 1 dose. 2.5 mL Eshan Trupiano L, PA   fluticasone (FLONASE) 50 MCG/ACT nasal spray Place 1 spray into both nostrils daily. 16 mL Kazaria Gaertner L, PA      PDMP not reviewed this encounter.   Maretta Bees, [place] 10/29/22 1650

## 2022-10-29 NOTE — Discharge Instructions (Addendum)
Please read the attached handout regarding your condition. Please use 1 drop every 24 hours to the affected eye.  Please also use 1 spray each nostril twice daily.  Please go home and take your blood pressure medication.  Please monitor ensure normalcy of blood pressure.  If you develop any eye pain, change in vision, fever, or swelling, please head to the emergency room.

## 2022-10-29 NOTE — ED Triage Notes (Signed)
Pt c/o lt eye redness, itchy, and drainage since yesterday. States took her normal pain meds but no relief.

## 2022-10-30 ENCOUNTER — Other Ambulatory Visit: Payer: Self-pay | Admitting: Medical

## 2022-10-30 DIAGNOSIS — I10 Essential (primary) hypertension: Secondary | ICD-10-CM

## 2022-11-01 ENCOUNTER — Encounter: Payer: 59 | Admitting: Medical

## 2022-11-28 ENCOUNTER — Ambulatory Visit
Admission: RE | Admit: 2022-11-28 | Discharge: 2022-11-28 | Disposition: A | Discharge status: Home / Self Care | Payer: 59 | Source: Ambulatory Visit | Attending: Medical | Admitting: Medical

## 2022-11-28 DIAGNOSIS — Z1231 Encounter for screening mammogram for malignant neoplasm of breast: Secondary | ICD-10-CM

## 2022-12-01 ENCOUNTER — Other Ambulatory Visit: Payer: Self-pay | Admitting: Medical

## 2022-12-01 DIAGNOSIS — I10 Essential (primary) hypertension: Secondary | ICD-10-CM

## 2022-12-02 NOTE — Progress Notes (Signed)
Results sent through MyChart

## 2022-12-04 ENCOUNTER — Other Ambulatory Visit: Payer: Self-pay | Admitting: Medical

## 2022-12-04 DIAGNOSIS — I10 Essential (primary) hypertension: Secondary | ICD-10-CM

## 2022-12-17 ENCOUNTER — Ambulatory Visit: Payer: 59 | Admitting: Medical

## 2022-12-17 ENCOUNTER — Encounter: Payer: Self-pay | Admitting: Medical

## 2022-12-17 VITALS — BP 124/68 | HR 80 | Ht 68.5 in | Wt 302.6 lb

## 2022-12-17 DIAGNOSIS — I10 Essential (primary) hypertension: Secondary | ICD-10-CM

## 2022-12-17 DIAGNOSIS — E559 Vitamin D deficiency, unspecified: Secondary | ICD-10-CM

## 2022-12-17 DIAGNOSIS — I890 Lymphedema, not elsewhere classified: Secondary | ICD-10-CM

## 2022-12-17 DIAGNOSIS — G8929 Other chronic pain: Secondary | ICD-10-CM

## 2022-12-17 DIAGNOSIS — E042 Nontoxic multinodular goiter: Secondary | ICD-10-CM

## 2022-12-17 DIAGNOSIS — M79604 Pain in right leg: Secondary | ICD-10-CM | POA: Diagnosis not present

## 2022-12-17 DIAGNOSIS — Z780 Asymptomatic menopausal state: Secondary | ICD-10-CM

## 2022-12-17 DIAGNOSIS — Z13220 Encounter for screening for lipoid disorders: Secondary | ICD-10-CM

## 2022-12-17 DIAGNOSIS — Z7185 Encounter for immunization safety counseling: Secondary | ICD-10-CM

## 2022-12-17 DIAGNOSIS — Z0000 Encounter for general adult medical examination without abnormal findings: Secondary | ICD-10-CM | POA: Diagnosis not present

## 2022-12-17 DIAGNOSIS — I878 Other specified disorders of veins: Secondary | ICD-10-CM

## 2022-12-17 DIAGNOSIS — E2839 Other primary ovarian failure: Secondary | ICD-10-CM

## 2022-12-17 DIAGNOSIS — R7301 Impaired fasting glucose: Secondary | ICD-10-CM

## 2022-12-17 DIAGNOSIS — D509 Iron deficiency anemia, unspecified: Secondary | ICD-10-CM

## 2022-12-17 DIAGNOSIS — R202 Paresthesia of skin: Secondary | ICD-10-CM

## 2022-12-17 DIAGNOSIS — K08109 Complete loss of teeth, unspecified cause, unspecified class: Secondary | ICD-10-CM | POA: Insufficient documentation

## 2022-12-17 LAB — HEMOGLOBIN A1C: Est. average glucose Bld gHb Est-mCnc: 131 mg/dL

## 2022-12-17 LAB — COMPREHENSIVE METABOLIC PANEL

## 2022-12-17 LAB — VITAMIN D 25 HYDROXY (VIT D DEFICIENCY, FRACTURES)

## 2022-12-17 LAB — LIPID PANEL

## 2022-12-17 LAB — VITAMIN B12

## 2022-12-17 MED ORDER — GABAPENTIN 100 MG PO CAPS
200.0000 mg | ORAL_CAPSULE | Freq: Two times a day (BID) | ORAL | 1 refills | Status: DC
Start: 2022-12-17 — End: 2023-10-01

## 2022-12-17 MED ORDER — ATENOLOL 25 MG PO TABS: 25.0000 mg | ORAL_TABLET | Freq: Every day | ORAL | 3 refills | Status: DC

## 2022-12-17 MED ORDER — POLYSACCHARIDE IRON COMPLEX 150 MG PO CAPS
150.0000 mg | ORAL_CAPSULE | Freq: Two times a day (BID) | ORAL | 1 refills | Status: DC
Start: 2022-12-17 — End: 2023-12-19

## 2022-12-17 MED ORDER — AMLODIPINE BESYLATE 10 MG PO TABS: 10.0000 mg | ORAL_TABLET | Freq: Every day | ORAL | 3 refills | Status: DC

## 2022-12-17 MED ORDER — CHLORTHALIDONE 25 MG PO TABS: 12.5000 mg | ORAL_TABLET | Freq: Every day | ORAL | 2 refills | Status: DC

## 2022-12-17 NOTE — Progress Notes (Signed)
Subjective:   HPI  Gina Lopez is a 67 y.o. female who presents for Chief Complaint  Patient presents with   Annual Exam    Fasting cpe, feels like her nerves are running all over body.- back to chest,to feel- not sure if its gabapentin. Has been out of work since May 30th- left leg pain- needs form to return    Patient Care Team: Jenin Birdsall, Cleda Mccreedy as PCP - General (Family Medicine) Sees dentist Sees eye doctor Dr. Ileene Patrick, GI Dr. Truett Mainland, cardiology Dr. Cristie Hem, podiatry [facility] Dr. Meredith Staggers, vascular   Concerns: For the last few months has been dealing with leg pain in the left leg and chronic lymphedema.  She has been seeing the vein clinic recently and has a new pump that she is using for lymphedema.  Her leg has improved in regards to pain.  She was released back to work  In recent weeks she has felt an unusual sensation in the skin.  Started few weeks ago along the right chest.  This talk specifically a pain but a unusual sensation in the body on both sides of the body, it comes and goes and can be in different parts of the body.  No specific numbness or weakness.  Hypertension-compliant with medications.  No particular issues.  No chest pain or shortness of breath.   Past Medical History:  Diagnosis Date   Allergy    environmental in spring   Anemia    Arthritis    knees   Colon polyp 07/2015   tubular adenoma, Dr. Adela Lank   Heart murmur    benign, 2013 echo   History of echocardiogram 02/22/2012   mild concentric LVH, EF 55%, left atrial mildly dilated, Dr. Rennis Golden   Hypertension    Lymphedema of leg    L>R, sees vein clinic, uses compression pump device at home daily; [facility]   Obesity    Wears partial dentures    upper only    Family History  Problem Relation Age of Onset   Arthritis Mother    Hypertension Mother    Obesity Mother    Hip fracture Mother    Kidney disease Mother    Alcohol abuse Father     Cancer Sister        type unknown   Cancer Sister    Diabetes Brother    Heart disease Neg Hx    Stroke Neg Hx    Colon cancer Neg Hx    Stomach cancer Neg Hx    Rectal cancer Neg Hx    Colon polyps Neg Hx    Breast cancer Neg Hx      Current Outpatient Medications:    Cholecalciferol (VITAMIN D) 50 MCG (2000 UT) CAPS, Take 1 capsule (2,000 Units total) by mouth daily., Disp: 90 capsule, Rfl: 3   Misc Natural Products (LEG VEIN & CIRCULATION PO), Take by mouth., Disp: , Rfl:    Olopatadine HCl (PAZEO) 0.7 % SOLN, Apply 1 drop to eye 1 day or 1 dose., Disp: 2.5 mL, Rfl: 0   potassium chloride SA (KLOR-CON M) 20 MEQ tablet, Take 1 tablet twice a day for the next 10 days, then take 1 tablet every day, Disp: 40 tablet, Rfl: 0   vitamin B-12 (CYANOCOBALAMIN) 100 MCG tablet, Take 100 mcg by mouth daily., Disp: , Rfl:    amLODipine (NORVASC) 10 MG tablet, Take 1 tablet (10 mg total) by mouth daily., Disp: 90 tablet,  Rfl: 3   atenolol (TENORMIN) 25 MG tablet, Take 1 tablet (25 mg total) by mouth daily., Disp: 90 tablet, Rfl: 3   chlorthalidone (HYGROTON) 25 MG tablet, Take 0.5 tablets (12.5 mg total) by mouth daily., Disp: 90 tablet, Rfl: 2   fluticasone (FLONASE) 50 MCG/ACT nasal spray, Place 1 spray into both nostrils daily. (Patient not taking: Reported on 12/17/2022), Disp: 16 mL, Rfl: 0   gabapentin (NEURONTIN) 100 MG capsule, Take 2 capsules (200 mg total) by mouth 2 (two) times daily., Disp: 360 capsule, Rfl: 1   iron polysaccharides (FERREX 150) 150 MG capsule, Take 1 capsule (150 mg total) by mouth 2 (two) times daily., Disp: 180 capsule, Rfl: 1  Allergies  Allergen Reactions   Codeine Other (See Comments)    Pt states it makes her feel dizzy and stay in her system forever   Terbinafine And Related Hives and Rash   Tramadol     Dizziness    Past Surgical History:  Procedure Laterality Date   COLONOSCOPY     age 31yo due to blood in stool, [place]   COLONOSCOPY  07/2015    tubular adenoma polyp, repeat 5 years, Dr. Adela Lank   CYST EXCISION  01/14/2015   finger   FINGER SURGERY     reattachment of tip of pinky finger on left hand   POLYPECTOMY     VEIN LIGATION AND STRIPPING     [facility]     Reviewed their medical, surgical, family, social, medication, and allergy history and updated chart as appropriate.  Review of Systems  Constitutional:  Negative for chills, fever, malaise/fatigue and weight loss.  HENT:  Negative for congestion, ear pain, hearing loss, sore throat and tinnitus.   Eyes:  Negative for blurred vision, pain and redness.  Respiratory:  Negative for cough, hemoptysis and shortness of breath.   Cardiovascular:  Positive for leg swelling. Negative for chest pain, palpitations, orthopnea and claudication.  Gastrointestinal:  Negative for abdominal pain, blood in stool, constipation, diarrhea, nausea and vomiting.  Genitourinary:  Negative for dysuria, flank pain, frequency, hematuria and urgency.  Musculoskeletal:  Negative for falls, joint pain and myalgias.  Skin:  Negative for itching and rash.  Neurological:  Positive for tingling. Negative for dizziness, speech change, weakness and headaches.  Endo/Heme/Allergies:  Negative for polydipsia. Does not bruise/bleed easily.  Psychiatric/Behavioral:  Negative for depression and memory loss. The patient is not nervous/anxious and does not have insomnia.         Objective:  BP 124/68   Pulse 80   Ht 5' 8.5" (1.74 m)   Wt (!) 302 lb 9.6 oz (137.3 kg)   BMI 45.34 kg/m   BP Readings from Last 3 Encounters:  12/17/22 124/68  10/29/22 (!) 170/77  10/23/22 120/70   Wt Readings from Last 3 Encounters:  12/17/22 (!) 302 lb 9.6 oz (137.3 kg)  10/23/22 298 lb 9.6 oz (135.4 kg)  09/27/22 (!) 302 lb (137 kg)    General appearance: alert, no distress, WD/WN, African American female Skin: unremarkable HEENT: normocephalic, conjunctiva/corneas normal, sclerae anicteric,  PERRLA, EOMi Neck: supple, no lymphadenopathy, no thyromegaly, no masses, normal ROM, no bruits Chest: non tender, normal shape and expansion Heart: RRR, normal S1, S2, no murmurs Lungs: CTA bilaterally, no wheezes, rhonchi, or rales Abdomen: +bs, soft, non tender, non distended, no masses, no hepatomegaly, no splenomegaly, no bruits Back: non tender, normal ROM, no scoliosis Musculoskeletal: flat feet bilat, otherwise upper extremities non tender, no  obvious deformity, normal ROM throughout, lower extremities non tender, no obvious deformity, normal ROM throughout Extremities: generalized lymphedema of bilat LE, no cyanosis, no clubbing Pulses: 2+ symmetric, upper and lower extremities, normal cap refill Neurological: alert, oriented x 3, CN2-12 intact, strength normal upper extremities and lower extremities, sensation normal throughout, DTRs 2+ throughout, no cerebellar signs, gait normal Psychiatric: normal affect, behavior normal, pleasant  Breast/gyn/rectal - declined/deferred    Assessment and Plan :   Encounter Diagnoses  Name Primary?   Routine general medical examination at a health care facility Yes   Essential hypertension    Vitamin D deficiency    Chronic pain of both lower extremities    Iron deficiency anemia, unspecified iron deficiency anemia type    Lymphedema    Venous stasis    Vaccine counseling    Multinodular goiter    Post-menopausal    Estrogen deficiency    Impaired fasting blood sugar    Screening for lipid disorders    Class 3 severe obesity with serious comorbidity and body mass index (BMI) of 40.0 to 44.9 in adult, unspecified obesity type (HCC)    Paresthesia    Full dentures     Today you had a preventative care visit or wellness visit.    Topics today may have included healthy lifestyle, diet, exercise, preventative care, vaccinations, sick and well care, proper use of emergency dept and after hours care, as well as other concerns.      Recommendations: Continue to return yearly for your annual wellness and preventative care visits.  This gives [place] a chance to discuss healthy lifestyle, exercise, vaccinations, review your chart record, and perform screenings where appropriate.  I recommend you see your eye doctor yearly for routine vision care.  I recommend you see your dentist yearly for routine dental care including hygiene visits twice yearly.   Vaccination recommendations were reviewed Immunization History  Administered Date(s) Administered   Hepatitis B 02/26/2018, 03/31/2018   Influenza Split 03/17/2012   Influenza,inj,Quad PF,6+ Mos 05/18/2015, 03/27/2016, 08/09/2020   PFIZER(Purple Top)SARS-COV-2 Vaccination 09/10/2019, 10/05/2019, 06/09/2020   Tdap 10/29/2013    Shingles vaccine - will request copy of vaccine record from pharmacy, [facility]ico[facility] Bang  I recommend a pneumococcal 20 vaccine. She declines today    Screening for cancer: Breast cancer screening: You should perform a self breast exam monthly.   We reviewed recommendations for regular mammograms and breast cancer screening.  Colon cancer screening:  I reviewed your colonoscopy on file that is up to date from 05/2020, repeat in 5-7 years  Cervical cancer screening: Pap updated 2022, normal, neg HPV  Skin cancer screening: Check your skin regularly for new changes, growing lesions, or other lesions of concern Come in for evaluation if you have skin lesions of concern.  Lung cancer screening: If you have a greater than 30 pack year history of tobacco use, then you qualify for lung cancer screening with a chest CT scan  We currently don't have screenings for other cancers besides breast, cervical, colon, and lung cancers.  If you have a strong family history of cancer or have other cancer screening concerns, please let me know.    Bone health: Get at least 150 minutes of aerobic exercise weekly Get weight bearing exercise at least once  weekly Schedule baseline bone density test Bone density screen 11/2021 was normal   Heart health: Get at least 150 minutes of aerobic exercise weekly Limit alcohol It is important to maintain a healthy blood pressure  and healthy cholesterol numbers  Echocardiogram 03/15/2020:  Normal LV systolic function with EF 55%. Left ventricle cavity is normal  in size. Normal global wall motion. Doppler evidence of grade I (impaired)  diastolic dysfunction, normal LAP. Calculated EF 55%.  Left atrial cavity is mildly dilated at 3.9 cm. Atrial septum is  aneurysmal without obvious patent foramen ovale.  Consider CT coronary calcium test heart disease screen    Separate significant issues discussed: Hypertension-compliant with medication, continue current medication  Lymphedema-recently saw vein clinic and has a new pump for lymphedema which she is using.  Continue with compression hose  Obesity-needs to work on efforts to lose weight through healthy diet and exercise  Vitamin D deficiency-continue supplement  Impaired fasting glucose-updated labs today  Tingling sensation in the body- labs today  Problem List Items Addressed This Visit     Essential hypertension   Relevant Medications   amLODipine (NORVASC) 10 MG tablet   atenolol (TENORMIN) 25 MG tablet   chlorthalidone (HYGROTON) 25 MG tablet   Lymphedema   Obesity   Iron deficiency anemia   Relevant Medications   iron polysaccharides (FERREX 150) 150 MG capsule   Vaccine counseling   Multinodular goiter   Relevant Medications   atenolol (TENORMIN) 25 MG tablet   Other Relevant Orders   TSH   Post-menopausal   Estrogen deficiency   Vitamin D deficiency   Relevant Orders   VITAMIN D 25 Hydroxy (Vit-D Deficiency, Fractures)   Impaired fasting blood sugar   Relevant Orders   Hemoglobin A1c   Screening for lipid disorders   Relevant Orders   Lipid panel   Chronic pain of both lower extremities   Relevant Medications    gabapentin (NEURONTIN) 100 MG capsule   Venous stasis   Full dentures   Routine general medical examination at a health care facility - Primary   Relevant Orders   TSH   Comprehensive metabolic panel   Lipid panel   Hemoglobin A1c   VITAMIN D 25 Hydroxy (Vit-D Deficiency, Fractures)   Vitamin B12   Other Visit Diagnoses     Paresthesia       Relevant Orders   VITAMIN D 25 Hydroxy (Vit-D Deficiency, Fractures)   Vitamin B12       Follow-up yearly for physical

## 2022-12-18 ENCOUNTER — Other Ambulatory Visit: Payer: Self-pay | Admitting: Medical

## 2022-12-18 LAB — LIPID PANEL
Chol/HDL Ratio: 2.9 ratio (ref 0.0–4.4)
Cholesterol, Total: 161 mg/dL (ref 100–199)
LDL Chol Calc (NIH): 93 mg/dL (ref 0–99)
Triglycerides: 66 mg/dL (ref 0–149)
VLDL Cholesterol Cal: 13 mg/dL (ref 5–40)

## 2022-12-18 LAB — COMPREHENSIVE METABOLIC PANEL
ALT: 11 IU/L (ref 0–32)
AST: 14 IU/L (ref 0–40)
Alkaline Phosphatase: 149 IU/L — ABNORMAL HIGH (ref 44–121)
BUN/Creatinine Ratio: 12 (ref 12–28)
Bilirubin Total: 0.6 mg/dL (ref 0.0–1.2)
CO2: 23 mmol/L (ref 20–29)
Chloride: 105 mmol/L (ref 96–106)
Creatinine, Ser: 0.85 mg/dL (ref 0.57–1.00)
Globulin, Total: 3 g/dL (ref 1.5–4.5)
Potassium: 3.9 mmol/L (ref 3.5–5.2)
Sodium: 141 mmol/L (ref 134–144)
eGFR: 76 mL/min/{1.73_m2} (ref >59)

## 2022-12-18 LAB — HEMOGLOBIN A1C: Hgb A1c MFr Bld: 6.2 % — ABNORMAL HIGH (ref 4.8–5.6)

## 2022-12-18 LAB — TSH: TSH: 0.896 u[IU]/mL (ref 0.450–4.500)

## 2022-12-18 NOTE — Progress Notes (Signed)
Results sent through MyChart

## 2023-01-04 ENCOUNTER — Telehealth: Payer: Self-pay | Admitting: Medical

## 2023-01-04 NOTE — Telephone Encounter (Signed)
Received FMLA forms from [employer], sent back in folder

## 2023-01-07 NOTE — Telephone Encounter (Signed)
Left message for pt to call.

## 2023-01-08 NOTE — Telephone Encounter (Signed)
Left message for pt to call me back 

## 2023-01-08 NOTE — Telephone Encounter (Signed)
Gina Lopez called and states she is back to work and doesn't require any limitations. She says she thought her fmla has been closed.  Also she said at her last visit she mentioned to you about under her right arm and you were supposed to be getting back with her about it?

## 2023-01-09 NOTE — Telephone Encounter (Signed)
Pt was notified and doesn't want to do any testing

## 2023-01-09 NOTE — Telephone Encounter (Signed)
Left message for pt to call me back 

## 2023-01-09 NOTE — Telephone Encounter (Signed)
Pt states she feels like she has be shocked but not having any pain, she just has a weird sensation that moves from under her arm to under her right breast.  Please advise. She didn't think she needs CT

## 2023-01-10 ENCOUNTER — Telehealth: Payer: Self-pay | Admitting: Medical

## 2023-01-10 NOTE — Telephone Encounter (Signed)
Medical records request sent to HIM from [employer].

## 2023-05-01 ENCOUNTER — Other Ambulatory Visit: Payer: Self-pay | Admitting: Medical

## 2023-05-01 DIAGNOSIS — I10 Essential (primary) hypertension: Secondary | ICD-10-CM

## 2023-09-17 ENCOUNTER — Encounter (HOSPITAL_COMMUNITY): Payer: Self-pay | Admitting: *Deleted

## 2023-09-17 ENCOUNTER — Emergency Department (HOSPITAL_COMMUNITY)
Admission: EM | Admit: 2023-09-17 | Discharge: 2023-09-18 | Disposition: A | Discharge status: Home / Self Care | Attending: Emergency Medicine | Admitting: Emergency Medicine

## 2023-09-17 ENCOUNTER — Emergency Department (HOSPITAL_COMMUNITY)

## 2023-09-17 ENCOUNTER — Other Ambulatory Visit: Payer: Self-pay

## 2023-09-17 DIAGNOSIS — Z79899 Other long term (current) drug therapy: Secondary | ICD-10-CM | POA: Insufficient documentation

## 2023-09-17 DIAGNOSIS — S60413A Abrasion of left middle finger, initial encounter: Secondary | ICD-10-CM | POA: Insufficient documentation

## 2023-09-17 DIAGNOSIS — W231XXA Caught, crushed, jammed, or pinched between stationary objects, initial encounter: Secondary | ICD-10-CM | POA: Diagnosis not present

## 2023-09-17 DIAGNOSIS — S6992XA Unspecified injury of left wrist, hand and finger(s), initial encounter: Secondary | ICD-10-CM

## 2023-09-17 DIAGNOSIS — Z23 Encounter for immunization: Secondary | ICD-10-CM | POA: Diagnosis not present

## 2023-09-17 DIAGNOSIS — I10 Essential (primary) hypertension: Secondary | ICD-10-CM | POA: Insufficient documentation

## 2023-09-17 NOTE — ED Triage Notes (Signed)
 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 Left finger injury; active bleeding through bandage. Last tetanus in 2015

## 2023-09-18 MED ORDER — TETANUS-DIPHTH-ACELL PERTUSSIS 5-2.5-18.5 LF-MCG/0.5 IM SUSY
0.5000 mL | PREFILLED_SYRINGE | Freq: Once | INTRAMUSCULAR | Status: AC
  Administered 2023-09-18: 0.5 mL via INTRAMUSCULAR
  Filled 2023-09-18: qty 0.5

## 2023-09-18 NOTE — ED Provider Notes (Signed)
 [place] EMERGENCY DEPARTMENT AT [facility] Provider Note   CSN: [id number] Arrival date & time: 09/17/23  2123     History  Chief Complaint  Patient presents with   Finger Injury    Gina Lopez is a 68 y.o. female.  The history is provided by the patient and medical records.   68 y.o. F with hx of HTN, obesity, LVH, Fe+ deficiency anemia, presenting to the ED for left middle finger injury.  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  Has multiple superficial wounds to left middle finger.  Last tetanus 2015.  Home Medications Prior to Admission medications   Medication Sig Start Date End Date Taking? Authorizing Provider  amLODipine (NORVASC) 10 MG tablet Take 1 tablet (10 mg total) by mouth daily. 12/17/22   Tysinger, Kermit Balo, PA-C  atenolol (TENORMIN) 25 MG tablet Take 1 tablet (25 mg total) by mouth daily. 12/17/22   Tysinger, Kermit Balo, PA-C  chlorthalidone (HYGROTON) 25 MG tablet Take 1 tablet by mouth once daily 05/01/23   Tysinger, Kermit Balo, PA-C  Cholecalciferol (VITAMIN D) 50 MCG (2000 UT) CAPS Take 1 capsule (2,000 Units total) by mouth daily. 10/31/21   Tysinger, Kermit Balo, PA-C  fluticasone (FLONASE) 50 MCG/ACT nasal spray Place 1 spray into both nostrils daily. Patient not taking: Reported on 12/17/2022 10/29/22   Guy Sandifer L, PA  gabapentin (NEURONTIN) 100 MG capsule Take 2 capsules (200 mg total) by mouth 2 (two) times daily. 12/17/22   Tysinger, Kermit Balo, PA-C  iron polysaccharides (FERREX 150) 150 MG capsule Take 1 capsule (150 mg total) by mouth 2 (two) times daily. 12/17/22   Tysinger, Kermit Balo, PA-C  Misc Natural Products (LEG VEIN & CIRCULATION PO) Take by mouth.    [provider]  Olopatadine HCl (PAZEO) 0.7 % SOLN Apply 1 drop to eye 1 day or 1 dose. 10/29/22   Crain, Whitney L, PA  vitamin B-12 (CYANOCOBALAMIN) 100 MCG tablet Take 100 mcg by mouth daily.    [provider]       Allergies    Codeine, Terbinafine and related, and Tramadol    Review of Systems   Review of Systems  Skin:  Positive for wound.  All other systems reviewed and are negative.   Physical Exam Updated Vital Signs BP (!) 178/79 (BP Location: Right Arm)   Pulse 67   Temp 97.6 F (36.4 C) (Oral)   Resp 18   SpO2 100%   Physical Exam Vitals and nursing note reviewed.  Constitutional:      Appearance: She is well-developed.  HENT:     Head: Normocephalic and atraumatic.  Eyes:     Conjunctiva/sclera: Conjunctivae normal.     Pupils: Pupils are equal, round, and reactive to light.  Cardiovascular:     Rate and Rhythm: Normal rate and regular rhythm.     Heart sounds: Normal heart sounds.  Pulmonary:     Effort: Pulmonary effort is normal.     Breath sounds: Normal breath sounds.  Musculoskeletal:        General: Normal range of motion.     Cervical back: Normal range of motion.     Comments: Left middle finger with multiple abrasions along dorsal and palmar surfaces, minimal blood oozing, no bony deformities, normal cap refill, no large tissue laceration/defect  Skin:    General: Skin is warm and dry.  Neurological:  Mental Status: She is alert and oriented to person, place, and time.     ED Results / Procedures / Treatments   Labs (all labs ordered are listed, but only abnormal results are displayed) Labs Reviewed - No data to display  EKG None  Radiology DG Finger Middle Left Result Date: 09/17/2023 CLINICAL DATA:  smashed in a jail cell Bleeding. EXAM: LEFT MIDDLE FINGER 2+V COMPARISON:  None Available. FINDINGS: There is no evidence of fracture or dislocation. 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 No erosive or bony destructive change. A dressing overlies the mid and distal digit with soft tissue thickening. No radiopaque foreign body. IMPRESSION: 1. Soft tissue thickening of the  mid and distal digit. No radiopaque foreign body or acute fracture. 2. 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 Electronically Signed   By: Narda Rutherford M.D.   On: 09/17/2023 23:46    Procedures Procedures    Medications Ordered in ED Medications  Tdap (BOOSTRIX) injection 0.5 mL (0.5 mLs Intramuscular Given 09/18/23 0330)    ED Course/ Medical Decision Making/ A&P                                 Medical Decision Making Amount and/or Complexity of Data Reviewed Radiology: ordered and independent interpretation performed.  Risk Prescription drug management.   68 year old female presenting to the ED with left middle finger injury after she smashed it in a door.  She has multiple superficial abrasions to dorsal and palmar surface of left middle finger.  There is no deeper laceration or tissue defect.  No bony deformities.  Finger is neurovascularly intact.  X-ray negative for any underlying bony abnormalities.  Tetanus has been updated.  Wound cleansed and dressed.  Discussed home wound care instructions.  Can follow-up with PCP.  Return here for new concerns.  Final Clinical Impression(s) / ED Diagnoses Final diagnoses:  Injury of finger of left hand, initial encounter    Rx / DC Orders ED Discharge Orders     None         Garlon Hatchet, PA-C 09/18/23 0344    Melene Plan, DO 09/18/23 0345

## 2023-09-18 NOTE — Discharge Instructions (Addendum)
 Keep finger clean with soap and warm water at least 1-2x daily. Can use topical neosporin or similar. Follow-up with your doctor. Return here for new concerns.

## 2023-10-01 ENCOUNTER — Other Ambulatory Visit: Payer: Self-pay | Admitting: Medical

## 2023-10-01 DIAGNOSIS — G8929 Other chronic pain: Secondary | ICD-10-CM

## 2023-10-01 NOTE — Telephone Encounter (Signed)
 Is this okay to refill?

## 2023-10-03 ENCOUNTER — Other Ambulatory Visit: Payer: Self-pay | Admitting: Medical

## 2023-10-03 DIAGNOSIS — G8929 Other chronic pain: Secondary | ICD-10-CM

## 2023-11-15 ENCOUNTER — Other Ambulatory Visit: Payer: Self-pay | Admitting: Medical

## 2023-11-15 DIAGNOSIS — Z1231 Encounter for screening mammogram for malignant neoplasm of breast: Secondary | ICD-10-CM

## 2023-12-06 ENCOUNTER — Ambulatory Visit
Admission: RE | Admit: 2023-12-06 | Discharge: 2023-12-06 | Disposition: A | Discharge status: Home / Self Care | Source: Ambulatory Visit | Attending: Medical | Admitting: Medical

## 2023-12-06 DIAGNOSIS — Z1231 Encounter for screening mammogram for malignant neoplasm of breast: Secondary | ICD-10-CM

## 2023-12-11 ENCOUNTER — Ambulatory Visit: Payer: Self-pay | Admitting: Medical

## 2023-12-18 ENCOUNTER — Ambulatory Visit (INDEPENDENT_AMBULATORY_CARE_PROVIDER_SITE_OTHER): Payer: 59 | Admitting: Medical

## 2023-12-18 VITALS — BP 138/70 | HR 63 | Ht 68.0 in | Wt 305.4 lb

## 2023-12-18 DIAGNOSIS — Z23 Encounter for immunization: Secondary | ICD-10-CM

## 2023-12-18 DIAGNOSIS — I890 Lymphedema, not elsewhere classified: Secondary | ICD-10-CM | POA: Diagnosis not present

## 2023-12-18 DIAGNOSIS — Z7185 Encounter for immunization safety counseling: Secondary | ICD-10-CM | POA: Diagnosis not present

## 2023-12-18 DIAGNOSIS — R7301 Impaired fasting glucose: Secondary | ICD-10-CM

## 2023-12-18 DIAGNOSIS — I10 Essential (primary) hypertension: Secondary | ICD-10-CM | POA: Diagnosis not present

## 2023-12-18 DIAGNOSIS — E042 Nontoxic multinodular goiter: Secondary | ICD-10-CM

## 2023-12-18 DIAGNOSIS — K760 Fatty (change of) liver, not elsewhere classified: Secondary | ICD-10-CM

## 2023-12-18 DIAGNOSIS — D509 Iron deficiency anemia, unspecified: Secondary | ICD-10-CM

## 2023-12-18 DIAGNOSIS — E559 Vitamin D deficiency, unspecified: Secondary | ICD-10-CM

## 2023-12-18 DIAGNOSIS — Z0000 Encounter for general adult medical examination without abnormal findings: Secondary | ICD-10-CM

## 2023-12-18 LAB — LIPID PANEL

## 2023-12-18 NOTE — Progress Notes (Addendum)
 Subjective:   HPI  Gina Lopez is a 68 y.o. female who presents for Chief Complaint  Patient presents with   Annual Exam    Fasting cpe, no concerns    Patient Care Team: Shaletha Humble, Alm GORMAN RIGGERS as PCP - General (Family Medicine) Sees dentist Sees eye doctor Dr. Elspeth Naval, GI Dr. Newman Lawrence, cardiology Dr. Magdalen Dickens, podiatry [facility] Dr. Reyes Pines, vascular   Concerns: Hypertension-compliant with medications.  not checking BPs at home.  No chest pain, no dyspnea on exertion, no shortness of breath  She would like to stop gabapentin .  She does not necessarily feel like it helps and wants to discontinue this.  Currently on  Gabapentin  100mg , 2 capsules BID.    She continues on iron  daily   Past Medical History:  Diagnosis Date   Allergy    environmental in spring   Anemia    Arthritis    knees   Colon polyp 07/2015   tubular adenoma, Dr. Naval   Heart murmur    benign, 2013 echo   History of echocardiogram 02/22/2012   mild concentric LVH, EF 55%, left atrial mildly dilated, Dr. Mona   Hypertension    Lymphedema of leg    L>R, sees vein clinic, uses compression pump device at home daily; [facility]   Obesity    Wears partial dentures    upper only    Family History  Problem Relation Age of Onset   Arthritis Mother    Hypertension Mother    Obesity Mother    Hip fracture Mother    Kidney disease Mother    Alcohol abuse Father    Cancer Sister        type unknown   Cancer Sister    Diabetes Brother    Heart disease Neg Hx    Stroke Neg Hx    Colon cancer Neg Hx    Stomach cancer Neg Hx    Rectal cancer Neg Hx    Colon polyps Neg Hx    Breast cancer Neg Hx      Current Outpatient Medications:    amLODipine  (NORVASC ) 10 MG tablet, Take 1 tablet (10 mg total) by mouth daily., Disp: 90 tablet, Rfl: 3   atenolol  (TENORMIN ) 25 MG tablet, Take 1 tablet (25 mg total) by mouth daily., Disp: 90 tablet, Rfl: 3    chlorthalidone  (HYGROTON ) 25 MG tablet, Take 1 tablet by mouth once daily, Disp: 30 tablet, Rfl: 6   Cholecalciferol (VITAMIN D ) 50 MCG (2000 UT) CAPS, Take 1 capsule (2,000 Units total) by mouth daily., Disp: 90 capsule, Rfl: 3   gabapentin  (NEURONTIN ) 100 MG capsule, Take 2 capsules by mouth twice daily, Disp: 360 capsule, Rfl: 0   iron  polysaccharides (FERREX 150) 150 MG capsule, Take 1 capsule (150 mg total) by mouth 2 (two) times daily., Disp: 180 capsule, Rfl: 1   Misc Natural Products (LEG VEIN & CIRCULATION PO), Take by mouth., Disp: , Rfl:   Allergies  Allergen Reactions   Codeine Other (See Comments)    Pt states it makes her feel dizzy and stay in her system forever   Terbinafine And Related Hives and Rash   Tramadol     Dizziness    Past Surgical History:  Procedure Laterality Date   COLONOSCOPY     age 51yo due to blood in stool, [place]   COLONOSCOPY  07/2015   tubular adenoma polyp, repeat 5 years, Dr. Naval   CYST EXCISION  01/14/2015   finger   FINGER SURGERY     reattachment of tip of pinky finger on left hand   POLYPECTOMY     VEIN LIGATION AND STRIPPING     [facility]     Reviewed their medical, surgical, family, social, medication, and allergy history and updated chart as appropriate.  Review of Systems  Constitutional:  Negative for chills, fever, malaise/fatigue and weight loss.  HENT:  Negative for congestion, ear pain, hearing loss, sore throat and tinnitus.   Eyes:  Negative for blurred vision, pain and redness.  Respiratory:  Negative for cough, hemoptysis and shortness of breath.   Cardiovascular:  Positive for leg swelling. Negative for chest pain, palpitations, orthopnea and claudication.  Gastrointestinal:  Negative for abdominal pain, blood in stool, constipation, diarrhea, nausea and vomiting.  Genitourinary:  Negative for dysuria, flank pain, frequency, hematuria and urgency.  Musculoskeletal:  Negative for falls, joint  pain and myalgias.  Skin:  Negative for itching and rash.  Neurological:  Negative for dizziness, tingling, speech change, weakness and headaches.  Endo/Heme/Allergies:  Negative for polydipsia. Does not bruise/bleed easily.  Psychiatric/Behavioral:  Negative for depression and memory loss. The patient is not nervous/anxious and does not have insomnia.         Objective:  BP 138/70   Pulse 63   Ht 5' 8 (1.727 m)   Wt (!) 305 lb 6.4 oz (138.5 kg)   SpO2 97%   BMI 46.44 kg/m   BP Readings from Last 3 Encounters:  12/18/23 138/70  09/18/23 (!) 178/79  12/17/22 124/68   Wt Readings from Last 3 Encounters:  12/18/23 (!) 305 lb 6.4 oz (138.5 kg)  12/17/22 (!) 302 lb 9.6 oz (137.3 kg)  10/23/22 298 lb 9.6 oz (135.4 kg)    General appearance: alert, no distress, WD/WN, African American female Skin: unremarkable HEENT: normocephalic, conjunctiva/corneas normal, sclerae anicteric, PERRLA, EOMi Neck: supple, no lymphadenopathy, no thyromegaly, no masses, normal ROM, no bruits Chest: non tender, normal shape and expansion Heart: RRR, normal S1, S2, faint brief 2 out of 6 systolic murmur heard in the upper sternal borders Lungs: CTA bilaterally, no wheezes, rhonchi, or rales Abdomen: +bs, soft, non tender, non distended, no masses, no hepatomegaly, no splenomegaly, no bruits Back: non tender, normal ROM, no scoliosis Musculoskeletal: flat feet bilat, otherwise upper extremities non tender, no obvious deformity, normal ROM throughout, lower extremities non tender, no obvious deformity, normal ROM throughout Extremities: generalized lymphedema of bilat LE, no cyanosis, no clubbing Pulses: 2+ symmetric, upper and lower extremities, normal cap refill Neurological: alert, oriented x 3, CN2-12 intact, strength normal upper extremities and lower extremities, sensation normal throughout, DTRs 2+ throughout, no cerebellar signs, gait normal Psychiatric: normal affect, behavior normal, pleasant   Breast/gyn/rectal - declined/deferred    Assessment and Plan :   Encounter Diagnoses  Name Primary?   Encounter for health maintenance examination in adult Yes   Need for pneumococcal 20-valent conjugate vaccination    Impaired fasting blood sugar    Iron  deficiency anemia, unspecified iron  deficiency anemia type    Lymphedema    Multinodular goiter    Vaccine counseling    Vitamin D  deficiency    Essential hypertension    Hepatic steatosis     Today you had a preventative care visit or wellness visit.    Topics today may have included healthy lifestyle, diet, exercise, preventative care, vaccinations, sick and well care, proper use of emergency dept and after hours care,  as well as other concerns.     Recommendations: Continue to return yearly for your annual wellness and preventative care visits.  This gives us  a chance to discuss healthy lifestyle, exercise, vaccinations, review your chart record, and perform screenings where appropriate.  I recommend you see your eye doctor yearly for routine vision care.  I recommend you see your dentist yearly for routine dental care including hygiene visits twice yearly.   Vaccination recommendations were reviewed Immunization History  Administered Date(s) Administered   Hepatitis B 02/26/2018, 03/31/2018   Influenza Split 03/17/2012   Influenza,inj,Quad PF,6+ Mos 05/18/2015, 03/27/2016, 08/09/2020   PFIZER(Purple Top)SARS-COV-2 Vaccination 09/10/2019, 10/05/2019, 06/09/2020   Tdap 10/29/2013, 09/18/2023    Shingles vaccine - will request copy of vaccine record from pharmacy, [facility]  Counseled on the pneumococcal vaccine.  Vaccine information sheet given.  Pneumococcal vaccine Prevnar 20 given after consent obtained.  I recommend a yearly flu shot   Screening for cancer: Breast cancer screening: You should perform a self breast exam monthly.   6/25 mammogram normal  Colon cancer screening:  I reviewed your colonoscopy  on file that is up to date from 05/2020, repeat in 5-7 years  Cervical cancer screening: Pap updated 2022, normal, neg HPV  Skin cancer screening: Check your skin regularly for new changes, growing lesions, or other lesions of concern Come in for evaluation if you have skin lesions of concern.  Lung cancer screening: If you have a greater than 30 pack year history of tobacco use, then you qualify for lung cancer screening with a chest CT scan  We currently don't have screenings for other cancers besides breast, cervical, colon, and lung cancers.  If you have a strong family history of cancer or have other cancer screening concerns, please let me know.    Bone health: Get at least 150 minutes of aerobic exercise weekly Get weight bearing exercise at least once weekly Schedule baseline bone density test Bone density screen 11/2021 was normal   Heart health: Get at least 150 minutes of aerobic exercise weekly Limit alcohol It is important to maintain a healthy blood pressure and healthy cholesterol numbers  Echocardiogram 03/15/2020:  Normal LV systolic function with EF 55%. Left ventricle cavity is normal  in size. Normal global wall motion. Doppler evidence of grade I (impaired)  diastolic dysfunction, normal LAP. Calculated EF 55%.  Left atrial cavity is mildly dilated at 3.9 cm. Atrial septum is  aneurysmal without obvious patent foramen ovale.  Consider CT coronary calcium test heart disease screen  EKG updated today, unchanged from 2021 EKG   Separate significant issues discussed: Hypertension-compliant with medication.  Last ultrasound 2021.  Blood pressure not quite to goal.  Continue amlodipine  10 mg daily, atenolol  25 mg daily, chlorthalidone  25 mg daily  Lymphedema -  Continue with compression hose, continue with pneumatic compression devices twice daily.  Obesity-needs to work on efforts to lose weight through healthy diet and exercise  Vitamin D   deficiency-continue supplement  Impaired fasting glucose-updated labs today  Chronic leg pain-she wants to wean off gabapentin .  I recommend changing to 1 capsule twice a day for 2 weeks then go to 1 capsule once daily.  History of iron  deficiency.  On iron  daily.  Updated labs today  Problem List Items Addressed This Visit     Essential hypertension   Relevant Orders   EKG 12-Lead   Lymphedema   Encounter for health maintenance examination in adult - Primary   Relevant Orders  Comprehensive metabolic panel with GFR   CBC   Lipid panel   TSH   Vitamin D , 25-hydroxy   Hemoglobin A1c   Vitamin B12   Iron , TIBC and Ferritin Panel   EKG 12-Lead   Iron  deficiency anemia   Relevant Orders   Vitamin B12   Iron , TIBC and Ferritin Panel   Vaccine counseling   Multinodular goiter   Vitamin D  deficiency   Relevant Orders   Vitamin D , 25-hydroxy   Impaired fasting blood sugar   Relevant Orders   Hemoglobin A1c   Hepatic steatosis   Other Visit Diagnoses       Need for pneumococcal 20-valent conjugate vaccination           Follow-up yearly for physical

## 2023-12-18 NOTE — Addendum Note (Signed)
 Addended by: VICCI HUSBAND A on: 12/18/2023 09:15 AM   Modules accepted: Orders

## 2023-12-19 ENCOUNTER — Other Ambulatory Visit: Payer: Self-pay | Admitting: Medical

## 2023-12-19 ENCOUNTER — Ambulatory Visit: Payer: Self-pay | Admitting: Medical

## 2023-12-19 DIAGNOSIS — I10 Essential (primary) hypertension: Secondary | ICD-10-CM

## 2023-12-19 DIAGNOSIS — D509 Iron deficiency anemia, unspecified: Secondary | ICD-10-CM

## 2023-12-19 LAB — LIPID PANEL
Cholesterol, Total: 155 mg/dL (ref 100–199)
HDL: 56 mg/dL (ref >39)
LDL CALC COMMENT:: 2.8 ratio (ref 0.0–4.4)
LDL Chol Calc (NIH): 85 mg/dL (ref 0–99)
Triglycerides: 72 mg/dL (ref 0–149)
VLDL Cholesterol Cal: 14 mg/dL (ref 5–40)

## 2023-12-19 LAB — COMPREHENSIVE METABOLIC PANEL WITH GFR
ALT: 19 IU/L (ref 0–32)
AST: 15 IU/L (ref 0–40)
Albumin: 4.2 g/dL (ref 3.9–4.9)
Alkaline Phosphatase: 163 IU/L — ABNORMAL HIGH (ref 44–121)
BUN/Creatinine Ratio: 15 (ref 12–28)
BUN: 13 mg/dL (ref 8–27)
Bilirubin Total: 0.4 mg/dL (ref 0.0–1.2)
CO2: 19 mmol/L — ABNORMAL LOW (ref 20–29)
Calcium: 9.6 mg/dL (ref 8.7–10.3)
Chloride: 108 mmol/L — ABNORMAL HIGH (ref 96–106)
Creatinine, Ser: 0.84 mg/dL (ref 0.57–1.00)
Globulin, Total: 3 g/dL (ref 1.5–4.5)
Glucose: 91 mg/dL (ref 70–99)
Potassium: 4.3 mmol/L (ref 3.5–5.2)
Sodium: 145 mmol/L — ABNORMAL HIGH (ref 134–144)
Total Protein: 7.2 g/dL (ref 6.0–8.5)
eGFR: 77 mL/min/1.73 (ref >59)

## 2023-12-19 LAB — CBC
Hematocrit: 40.7 % (ref 34.0–46.6)
Hemoglobin: 11.8 g/dL (ref 11.1–15.9)
MCH: 24.6 pg — ABNORMAL LOW (ref 26.6–33.0)
MCHC: 29 g/dL — ABNORMAL LOW (ref 31.5–35.7)
MCV: 85 fL (ref 79–97)
Platelets: 234 x10E3/uL (ref 150–450)
RBC: 4.8 x10E6/uL (ref 3.77–5.28)
RDW: 14.2 % (ref 11.7–15.4)
WBC: 7.2 x10E3/uL (ref 3.4–10.8)

## 2023-12-19 LAB — IRON,TIBC AND FERRITIN PANEL
Ferritin: 62 ng/mL (ref 15–150)
Iron Saturation: 19 (ref 15–55)
Iron: 57 ug/dL (ref 27–139)
Total Iron Binding Capacity: 300 ug/dL (ref 250–450)
UIBC: 243 ug/dL (ref 118–369)

## 2023-12-19 LAB — TSH: TSH: 0.446 u[IU]/mL — AB (ref 0.450–4.500)

## 2023-12-19 LAB — HEMOGLOBIN A1C
Est. average glucose Bld gHb Est-mCnc: 128 mg/dL
Hgb A1c MFr Bld: 6.1 % — ABNORMAL HIGH (ref 4.8–5.6)

## 2023-12-19 LAB — VITAMIN D 25 HYDROXY (VIT D DEFICIENCY, FRACTURES): Vit D, 25-Hydroxy: 47.4 ng/mL (ref 30.0–100.0)

## 2023-12-19 LAB — VITAMIN B12: Vitamin B-12: 363 pg/mL (ref 232–1245)

## 2023-12-19 MED ORDER — AMLODIPINE BESYLATE 10 MG PO TABS: 10.0000 mg | ORAL_TABLET | Freq: Every day | ORAL | 3 refills | Status: AC

## 2023-12-19 MED ORDER — CHLORTHALIDONE 25 MG PO TABS
25.0000 mg | ORAL_TABLET | Freq: Every day | ORAL | 3 refills | Status: AC
Start: 2023-12-19 — End: ?

## 2023-12-19 MED ORDER — ATENOLOL 25 MG PO TABS: 25.0000 mg | ORAL_TABLET | Freq: Every day | ORAL | 3 refills | Status: AC

## 2023-12-19 MED ORDER — POLYSACCHARIDE IRON COMPLEX 150 MG PO CAPS: 150.0000 mg | ORAL_CAPSULE | Freq: Two times a day (BID) | ORAL | 1 refills | Status: AC

## 2023-12-19 MED ORDER — VITAMIN D 50 MCG (2000 UT) PO CAPS: 1.0000 | ORAL_CAPSULE | Freq: Every day | ORAL | 3 refills | Status: AC

## 2023-12-19 NOTE — Progress Notes (Signed)
 Please have lab add free T4 due to abnormal TSH.  labs sent through MyChart

## 2023-12-20 ENCOUNTER — Ambulatory Visit: Payer: Self-pay

## 2023-12-20 ENCOUNTER — Other Ambulatory Visit: Payer: Self-pay | Admitting: Medical

## 2023-12-20 ENCOUNTER — Encounter: Payer: Self-pay | Admitting: Medical

## 2023-12-20 DIAGNOSIS — R7989 Other specified abnormal findings of blood chemistry: Secondary | ICD-10-CM

## 2023-12-20 DIAGNOSIS — R946 Abnormal results of thyroid function studies: Secondary | ICD-10-CM | POA: Insufficient documentation

## 2023-12-20 LAB — SPECIMEN STATUS REPORT

## 2023-12-20 LAB — T4, FREE: Free T4: 0.93 ng/dL (ref 0.82–1.77)

## 2023-12-20 MED ORDER — HYDROCODONE-ACETAMINOPHEN 5-325 MG PO TABS: 1.0000 | ORAL_TABLET | Freq: Four times a day (QID) | ORAL | 0 refills | Status: DC | PRN

## 2023-12-20 NOTE — Progress Notes (Signed)
 Thyroid  lab is little unusual.  Lets recheck thyroid  labs with the nurse visit in 3 to 4 weeks.  I will place lab orders  Also, would she be interested in using something like Zepbound or Wegovy to help with weight loss efforts and blood sugar control.  This is a weekly injectable medicine.  She may want to see if her insurance will pay for this.

## 2023-12-20 NOTE — Telephone Encounter (Signed)
 FYI Only or Action Required?: FYI only for provider.  Patient was last seen in primary care on 12/18/2023 by Bulah Alm RAMAN, PA-C.  Called Nurse Triage reporting immunization reactions .  Symptoms began yesterday.  Interventions attempted: Nothing.  Symptoms are: unchanged.  Triage Disposition: Home Care  Patient/caregiver understands and will follow disposition?: Yes  Copied from CRM [id number]. Topic: Clinical - Medical Advice >> Dec 20, 2023  1:17 PM Yolanda T wrote: Reason for CRM: patient called stated she was achy all over the next day after she got the pneumonia shot. Today she says its feels like a stabbing pain in her right shoulder blade where she got the shot. Please f/u with patient Reason for Disposition  Pneumococcal vaccine reactions  Injection site reaction to any vaccine  Answer Assessment - Initial Assessment Questions 1. SYMPTOMS: What is the main symptom? (e.g., pain, redness, or swelling at injection site; feeling tired, fever, muscle aches)      Pain at right shoulder, body aches 2. ONSET: When was the vaccine (shot) given? How much later did thep[ begin? (e.g., hours, days ago)      12/18/23 3. SEVERITY: How bad is it?      Moderate 4. FEVER: Do you have a fever? If Yes, ask: What is your temperature, how was it measured, and when did it start?      No 5. IMMUNIZATIONS GIVEN: What shots have you recently received?     12/18/23 6. PAST REACTIONS: Have you reacted to immunizations before? If Yes, ask: What happened?      7. OTHER SYMPTOMS: Do you have any other symptoms?     denies  Protocols used: Immunization Reactions-A-AH

## 2024-01-09 ENCOUNTER — Encounter (HOSPITAL_COMMUNITY): Payer: Self-pay

## 2024-01-09 ENCOUNTER — Ambulatory Visit (HOSPITAL_COMMUNITY): Admission: EM | Admit: 2024-01-09 | Discharge: 2024-01-09 | Disposition: A | Discharge status: Home / Self Care

## 2024-01-09 DIAGNOSIS — Z20822 Contact with and (suspected) exposure to covid-19: Secondary | ICD-10-CM

## 2024-01-09 DIAGNOSIS — U071 COVID-19: Secondary | ICD-10-CM

## 2024-01-09 LAB — POC SARS CORONAVIRUS 2 AG -  ED: SARS Coronavirus 2 Ag: POSITIVE — AB

## 2024-01-09 MED ORDER — BENZONATATE 200 MG PO CAPS: 200.0000 mg | ORAL_CAPSULE | Freq: Three times a day (TID) | ORAL | 0 refills | Status: AC

## 2024-01-09 MED ORDER — GUAIFENESIN ER 600 MG PO TB12: 600.0000 mg | ORAL_TABLET | Freq: Two times a day (BID) | ORAL | 0 refills | Status: AC

## 2024-01-09 NOTE — ED Triage Notes (Signed)
 Patient here today with c/o cough, wheeze, SOB, aches, chills, and sweats since Monday night. Her family tested positive for Covid.

## 2024-01-09 NOTE — Discharge Instructions (Signed)
 You tested positive for COVID-19.  This is a viral illness that typically last 5 to 7 days in duration.  You can alternate between 800 mg of ibuprofen  and 500 mg of Tylenol  every 4-6 hours for fever, body aches and chills.  Use the Tessalon  every 8 hours as needed for cough.  Mucinex  to help loosen secretions in addition to at least 64 ounces of water daily.  Symptoms should improve over the next few days, if no improvement or any changes return to clinic or follow-up with primary care provider for reevaluation.

## 2024-01-09 NOTE — ED Provider Notes (Signed)
 [facility]    CSN: [id number] Arrival date & time: 01/09/24  0813      History   Chief Complaint Chief Complaint  Patient presents with   Cough    HPI MINTIE Gina Lopez is a 68 y.o. female.   Patient presents to clinic over concern of generalized bodyaches, hot and cold chills, sore throat, congestion, rhinorrhea and a cough for the past 4 days.  She worked overnight Sunday night into the Monday Monday night started to feel bad.  She states she stayed in bed all day with generalized bodyaches.  Family recent tested positive for COVID-19, she was around them this weekend in the cookout.  Has not had any wheezing or shortness of breath.  Denies chest pain.  Has not tried medications or interventions for her symptoms.  She does not smoke.  The history is provided by the patient and medical records.  Cough   Past Medical History:  Diagnosis Date   Abnormal thyroid  exam 12/20/2023   Allergy    environmental in spring   Anemia    Arthritis    knees   Colon polyp 07/2015   tubular adenoma, Dr. Leigh   Heart murmur    benign, 2013 echo   History of echocardiogram 02/22/2012   mild concentric LVH, EF 55%, left atrial mildly dilated, Dr. Mona   Hypertension    Lymphedema of leg    L>R, sees vein clinic, uses compression pump device at home daily; [facility] Clinic   Obesity    Wears partial dentures    upper only    Patient Active Problem List   Diagnosis Date Noted   Abnormal thyroid  exam 12/20/2023   Full dentures 12/17/2022   Routine general medical examination at a health care facility 12/17/2022   Chronic pain of both lower extremities 10/23/2022   Venous stasis 10/23/2022   Hypokalemia 12/04/2021   Encounter for screening mammogram for malignant neoplasm of breast 10/27/2021   Postmenopausal estrogen deficiency 10/27/2021   Screening for lipid disorders 10/27/2021   Hepatic steatosis 10/27/2021   Vitamin D  deficiency 08/10/2021   Impaired fasting  blood sugar 08/10/2021   Need for influenza vaccination 08/09/2020   Pain in both feet 08/09/2020   Post-menopausal 08/09/2020   Estrogen deficiency 08/09/2020   Flat feet 08/09/2020   Plantar fasciitis 08/09/2020   Tendonitis, Achilles, left 05/30/2020   Multinodular goiter 01/26/2020   Allergic rhinitis due to pollen 09/04/2019   Leg cramp 09/04/2019   Digital mucinous cyst of finger of right hand 05/28/2017   Vaccine counseling 05/28/2017   Iron  deficiency anemia 03/27/2016   Lymphedema 05/18/2015   Encounter for health maintenance examination in adult 05/18/2015   Obesity 05/18/2015   Screening for diabetes mellitus 05/18/2015   Screening for cervical cancer 05/18/2015   Need for prophylactic vaccination and inoculation against influenza 05/18/2015   Essential hypertension 04/29/2015   LVH (left ventricular hypertrophy) 04/29/2015   Heart murmur 01/09/2012    Past Surgical History:  Procedure Laterality Date   COLONOSCOPY     age 89yo due to blood in stool, No Name   COLONOSCOPY  07/2015   tubular adenoma polyp, repeat 5 years, Dr. Leigh   CYST EXCISION  01/14/2015   finger   FINGER SURGERY     reattachment of tip of pinky finger on left hand   POLYPECTOMY     VEIN LIGATION AND STRIPPING     [facility]    OB History   No  obstetric history on file.      Home Medications    Prior to Admission medications   Medication Sig Start Date End Date Taking? Authorizing Provider  benzonatate  (TESSALON ) 200 MG capsule Take 1 capsule (200 mg total) by mouth every 8 (eight) hours. 01/09/24  Yes Ifeanyi Mickelson  N, FNP  guaiFENesin  (MUCINEX ) 600 MG 12 hr tablet Take 1 tablet (600 mg total) by mouth 2 (two) times daily for 5 days. 01/09/24 01/14/24 Yes Raquon Milledge  N, FNP  Multiple Vitamin (MULTIVITAMIN) tablet Take 1 tablet by mouth daily.   Yes [provider]  amLODipine  (NORVASC ) 10 MG tablet Take 1 tablet (10 mg total) by mouth daily. 12/19/23    Tysinger, Alm RAMAN, PA-C  atenolol  (TENORMIN ) 25 MG tablet Take 1 tablet (25 mg total) by mouth daily. 12/19/23   Tysinger, Alm RAMAN, PA-C  chlorthalidone  (HYGROTON ) 25 MG tablet Take 1 tablet (25 mg total) by mouth daily. 12/19/23   Tysinger, Alm RAMAN, PA-C  Cholecalciferol (VITAMIN D ) 50 MCG (2000 UT) CAPS Take 1 capsule (2,000 Units total) by mouth daily. 12/19/23   Tysinger, Alm RAMAN, PA-C  gabapentin  (NEURONTIN ) 100 MG capsule Take 2 capsules by mouth twice daily 10/03/23   Tysinger, Alm RAMAN, PA-C  iron  polysaccharides (FERREX 150) 150 MG capsule Take 1 capsule (150 mg total) by mouth 2 (two) times daily. 12/19/23   Tysinger, Alm RAMAN, PA-C    Family History Family History  Problem Relation Age of Onset   Arthritis Mother    Hypertension Mother    Obesity Mother    Hip fracture Mother    Kidney disease Mother    Alcohol abuse Father    Cancer Sister        type unknown   Cancer Sister    Diabetes Brother    Heart disease Neg Hx    Stroke Neg Hx    Colon cancer Neg Hx    Stomach cancer Neg Hx    Rectal cancer Neg Hx    Colon polyps Neg Hx    Breast cancer Neg Hx     Social History Social History   Tobacco Use   Smoking status: Former    Current packs/day: 0.00    Average packs/day: 0.3 packs/day for 1 year (0.3 ttl pk-yrs)    Types: Cigarettes    Start date: 06/11/1980    Quit date: 06/11/1981    Years since quitting: 42.6   Smokeless tobacco: Never  Vaping Use   Vaping status: Never Used  Substance Use Topics   Alcohol use: Not Currently    Comment: rare   Drug use: No     Allergies   Codeine, Terbinafine and related, and Tramadol   Review of Systems Review of Systems  Per HPI  Physical Exam Triage Vital Signs ED Triage Vitals  Encounter Vitals Group     BP 01/09/24 0836 126/74     Girls Systolic BP Percentile --      Girls Diastolic BP Percentile --      Boys Systolic BP Percentile --      Boys Diastolic BP Percentile --      Pulse Rate 01/09/24 0836 68      Resp 01/09/24 0836 16     Temp 01/09/24 0836 98.2 F (36.8 C)     Temp Source 01/09/24 0836 Oral     SpO2 01/09/24 0836 97 %     Weight --      Height --  Head Circumference --      Peak Flow --      Pain Score 01/09/24 0838 0     Pain Loc --      Pain Education --      Exclude from Growth Chart --    No data found.  Updated Vital Signs BP 126/74 (BP Location: Left Arm)   Pulse 68   Temp 98.2 F (36.8 C) (Oral)   Resp 16   SpO2 97%   Visual Acuity Right Eye Distance:   Left Eye Distance:   Bilateral Distance:    Right Eye Near:   Left Eye Near:    Bilateral Near:     Physical Exam Vitals and nursing note reviewed.  Constitutional:      Appearance: Normal appearance.  HENT:     Head: Normocephalic and atraumatic.     Right Ear: External ear normal.     Left Ear: External ear normal.     Nose: Congestion and rhinorrhea present.     Mouth/Throat:     Mouth: Mucous membranes are moist.     Pharynx: Posterior oropharyngeal erythema present.  Cardiovascular:     Rate and Rhythm: Normal rate and regular rhythm.     Heart sounds: Normal heart sounds. No murmur heard. Pulmonary:     Effort: Pulmonary effort is normal. No respiratory distress.     Breath sounds: No wheezing.  Skin:    General: Skin is warm and dry.  Neurological:     General: No focal deficit present.     Mental Status: She is alert and oriented to person, place, and time.  Psychiatric:        Mood and Affect: Mood normal.        Behavior: Behavior normal.      UC Treatments / Results  Labs (all labs ordered are listed, but only abnormal results are displayed) Labs Reviewed  POC SARS CORONAVIRUS 2 AG -  ED - Abnormal; Notable for the following components:      Result Value   SARS Coronavirus 2 Ag Positive (*)    All other components within normal limits    EKG   Radiology No results found.  Procedures Procedures (including critical care time)  Medications Ordered in  UC Medications - No data to display  Initial Impression / Assessment and Plan / UC Course  I have reviewed the triage vital signs and the nursing notes.  Pertinent labs & imaging results that were available during my care of the patient were reviewed by me and considered in my medical decision making (see chart for details).  Vitals and triage reviewed, patient is hemodynamically stable.  Lungs vesicular, heart with regular rate and rhythm.  No acute distress, able to speak in full sentences.  Congestion, rhinorrhea and postnasal drip present on physical exam.  POC COVID testing positive, symptomatic management for viral illness discussed.  Plan of care, follow-up care return precautions given, no questions at this time.  Work note provided.     Final Clinical Impressions(s) / UC Diagnoses   Final diagnoses:  Exposure to COVID-19 virus  COVID-19 virus infection   Discharge Instructions   None    ED Prescriptions     Medication Sig Dispense Auth. Provider   benzonatate  (TESSALON ) 200 MG capsule Take 1 capsule (200 mg total) by mouth every 8 (eight) hours. 30 capsule Dreama, Copelyn Widmer  N, FNP   guaiFENesin  (MUCINEX ) 600 MG 12 hr tablet Take 1 tablet (600 mg total)  by mouth 2 (two) times daily for 5 days. 10 tablet Dreama Louvella SAILOR, FNP      PDMP not reviewed this encounter.   Dreama, Luis Sami  N, FNP 01/09/24 [phone]

## 2024-02-04 ENCOUNTER — Encounter: Payer: Self-pay | Admitting: Family Medicine

## 2024-06-24 ENCOUNTER — Encounter (HOSPITAL_COMMUNITY): Payer: Self-pay

## 2024-06-24 ENCOUNTER — Ambulatory Visit (HOSPITAL_COMMUNITY): Admission: EM | Admit: 2024-06-24 | Discharge: 2024-06-24 | Disposition: A | Discharge status: Home / Self Care

## 2024-06-24 DIAGNOSIS — R6883 Chills (without fever): Secondary | ICD-10-CM

## 2024-06-24 DIAGNOSIS — J069 Acute upper respiratory infection, unspecified: Secondary | ICD-10-CM

## 2024-06-24 LAB — POC SOFIA SARS ANTIGEN FIA: SARS Coronavirus 2 Ag: NEGATIVE

## 2024-06-24 LAB — POCT INFLUENZA A/B
Influenza A, POC: NEGATIVE
Influenza B, POC: NEGATIVE

## 2024-06-24 MED ORDER — PROMETHAZINE-DM 6.25-15 MG/5ML PO SYRP: 5.0000 mL | ORAL_SOLUTION | Freq: Every evening | ORAL | 0 refills | Status: AC | PRN

## 2024-06-24 NOTE — ED Triage Notes (Signed)
 Pt has c/o chills, dizziness x 2 days, and body aches x 1 day. 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 Pt hasn't taking any medications for symptoms.

## 2024-06-24 NOTE — ED Provider Notes (Signed)
 " [facility]    CSN: [id number] Arrival date & time: 06/24/24  0805      History   Chief Complaint Chief Complaint  Patient presents with   Chills   Generalized Body Aches    HPI Gina Lopez is a 69 y.o. female.   This 69 year old female is being seen for complaints of chills that started 2 days ago.  She reports yesterday she had onset of dizziness, ear fullness, scratchy throat, nonproductive cough, body aches.  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  She denies headache, nasal congestion.  She denies chest pain, shortness of breath, abdominal pain, nausea, vomiting, diarrhea.     Past Medical History:  Diagnosis Date   Abnormal thyroid  exam 12/20/2023   Allergy    environmental in spring   Anemia    Arthritis    knees   Colon polyp 07/2015   tubular adenoma, Dr. Leigh   Heart murmur    benign, 2013 echo   History of echocardiogram 02/22/2012   mild concentric LVH, EF 55%, left atrial mildly dilated, Dr. Mona   Hypertension    Lymphedema of leg    L>R, sees vein clinic, uses compression pump device at home daily; [facility]   Obesity    Wears partial dentures    upper only    Patient Active Problem List   Diagnosis Date Noted   Abnormal thyroid  exam 12/20/2023   Full dentures 12/17/2022   Routine general medical examination at a health care facility 12/17/2022   Chronic pain of both lower extremities 10/23/2022   Venous stasis 10/23/2022   Hypokalemia 12/04/2021   Encounter for screening mammogram for malignant neoplasm of breast 10/27/2021   Postmenopausal estrogen deficiency 10/27/2021   Screening for lipid disorders 10/27/2021   Hepatic steatosis 10/27/2021   Vitamin D  deficiency 08/10/2021   Impaired fasting blood sugar 08/10/2021   Need for influenza vaccination 08/09/2020   Pain in both feet 08/09/2020   Post-menopausal 08/09/2020   Estrogen deficiency  08/09/2020   Flat feet 08/09/2020   Plantar fasciitis 08/09/2020   Tendonitis, Achilles, left 05/30/2020   Multinodular goiter 01/26/2020   Allergic rhinitis due to pollen 09/04/2019   Leg cramp 09/04/2019   Digital mucinous cyst of finger of right hand 05/28/2017   Vaccine counseling 05/28/2017   Iron  deficiency anemia 03/27/2016   Lymphedema 05/18/2015   Encounter for health maintenance examination in adult 05/18/2015   Obesity 05/18/2015   Screening for diabetes mellitus 05/18/2015   Screening for cervical cancer 05/18/2015   Need for prophylactic vaccination and inoculation against influenza 05/18/2015   Essential hypertension 04/29/2015   LVH (left ventricular hypertrophy) 04/29/2015   Heart murmur 01/09/2012    Past Surgical History:  Procedure Laterality Date   COLONOSCOPY     age 26yo due to blood in stool, [facility]   COLONOSCOPY  07/2015   tubular adenoma polyp, repeat 5 years, Dr. Leigh   CYST EXCISION  01/14/2015   finger   FINGER SURGERY     reattachment of tip of pinky finger on left hand   POLYPECTOMY     VEIN LIGATION AND STRIPPING     [facility]    OB History   No obstetric history on file.      Home Medications    Prior to Admission medications  Medication Sig Start Date End Date Taking? Authorizing Provider  promethazine -dextromethorphan (  PROMETHAZINE -DM) 6.25-15 MG/5ML syrup Take 5 mLs by mouth at bedtime as needed for cough. 06/24/24  Yes Bertine Schlottman C, FNP  amLODipine  (NORVASC ) 10 MG tablet Take 1 tablet (10 mg total) by mouth daily. 12/19/23   Tysinger, Alm RAMAN, PA-C  atenolol  (TENORMIN ) 25 MG tablet Take 1 tablet (25 mg total) by mouth daily. 12/19/23   Tysinger, Alm RAMAN, PA-C  benzonatate  (TESSALON ) 200 MG capsule Take 1 capsule (200 mg total) by mouth every 8 (eight) hours. 01/09/24   Ball, [place]  G, FNP  chlorthalidone  (HYGROTON ) 25 MG tablet Take 1 tablet (25 mg total) by mouth daily. 12/19/23   Tysinger, Alm RAMAN, PA-C   Cholecalciferol (VITAMIN D ) 50 MCG (2000 UT) CAPS Take 1 capsule (2,000 Units total) by mouth daily. 12/19/23   Tysinger, Alm RAMAN, PA-C  gabapentin  (NEURONTIN ) 100 MG capsule Take 2 capsules by mouth twice daily 10/03/23   Tysinger, Alm RAMAN, PA-C  iron  polysaccharides (FERREX 150) 150 MG capsule Take 1 capsule (150 mg total) by mouth 2 (two) times daily. 12/19/23   Tysinger, Alm RAMAN, PA-C  Multiple Vitamin (MULTIVITAMIN) tablet Take 1 tablet by mouth daily.    [provider]    Family History Family History  Problem Relation Age of Onset   Arthritis Mother    Hypertension Mother    Obesity Mother    Hip fracture Mother    Kidney disease Mother    Alcohol abuse Father    Cancer Sister        type unknown   Cancer Sister    Diabetes Brother    Heart disease Neg Hx    Stroke Neg Hx    Colon cancer Neg Hx    Stomach cancer Neg Hx    Rectal cancer Neg Hx    Colon polyps Neg Hx    Breast cancer Neg Hx     Social History Social History[1]   Allergies   Codeine, Terbinafine and related, and Tramadol   Review of Systems Review of Systems  Constitutional:  Positive for activity change and chills. Negative for appetite change and fever.  HENT:  Positive for sore throat. Negative for congestion and ear pain.   Respiratory:  Positive for cough. Negative for shortness of breath.   Cardiovascular:  Negative for chest pain.  Gastrointestinal:  Negative for abdominal pain, diarrhea, nausea and vomiting.  Musculoskeletal:  Positive for myalgias.  Skin:  Negative for color change and rash.  Neurological:  Positive for dizziness. Negative for headaches.  All other systems reviewed and are negative.    Physical Exam Triage Vital Signs ED Triage Vitals [06/24/24 0832]  Encounter Vitals Group     BP 127/77     Girls Systolic BP Percentile      Girls Diastolic BP Percentile      Boys Systolic BP Percentile      Boys Diastolic BP Percentile      Pulse Rate 66     Resp 17      Temp 98.2 F (36.8 C)     Temp Source Oral     SpO2 95 %     Weight      Height      Head Circumference      Peak Flow      Pain Score      Pain Loc      Pain Education      Exclude from Growth Chart    No data found.  Updated Vital Signs BP 127/77 (  BP Location: Left Arm)   Pulse 66   Temp 98.2 F (36.8 C) (Oral)   Resp 17   SpO2 95%   Visual Acuity Right Eye Distance:   Left Eye Distance:   Bilateral Distance:    Right Eye Near:   Left Eye Near:    Bilateral Near:     Physical Exam Vitals and nursing note reviewed.  Constitutional:      General: She is not in acute distress.    Appearance: She is well-developed. She is not toxic-appearing.     Comments: Pleasant female appearing stated age found sitting in chair in no acute distress.  HENT:     Head: Normocephalic and atraumatic.     Right Ear: Tympanic membrane and external ear normal.     Left Ear: Tympanic membrane and external ear normal.     Nose: No congestion or rhinorrhea.     Mouth/Throat:     Lips: Pink.     Mouth: Mucous membranes are moist.     Pharynx: No oropharyngeal exudate or posterior oropharyngeal erythema.  Eyes:     Conjunctiva/sclera: Conjunctivae normal.  Cardiovascular:     Rate and Rhythm: Normal rate and regular rhythm.     Heart sounds: Normal heart sounds. No murmur heard. Pulmonary:     Effort: Pulmonary effort is normal. No respiratory distress.     Breath sounds: Normal breath sounds.  Musculoskeletal:     Cervical back: Normal range of motion.  Skin:    General: Skin is warm and dry.     Capillary Refill: Capillary refill takes less than 2 seconds.  Neurological:     Mental Status: She is alert.  Psychiatric:        Mood and Affect: Mood normal.      UC Treatments / Results  Labs (all labs ordered are listed, but only abnormal results are displayed) Labs Reviewed  POC SOFIA SARS ANTIGEN FIA  POCT INFLUENZA A/B    EKG   Radiology No results  found.  Procedures Procedures (including critical care time)  Medications Ordered in UC Medications - No data to display  Initial Impression / Assessment and Plan / UC Course  I have reviewed the triage vital signs and the nursing notes.  Pertinent labs & imaging results that were available during my care of the patient were reviewed by me and considered in my medical decision making (see chart for details).     Vitals and triage reviewed, patient is hemodynamically stable.  COVID and flu swabs obtained and are negative.  Presentation consistent with viral respiratory illness.  She is given a prescription for promethazine -DM.  Abortive care with Tylenol  and/or ibuprofen , guaifenesin , saline nasal sprays.  Advised honey water, salt water gargles, cool-mist humidifier.  Plan of care, follow-up care, return precautions given, no questions at this time.  Work note provided. Final Clinical Impressions(s) / UC Diagnoses   Final diagnoses:  Chills  Upper respiratory tract infection, unspecified type     Discharge Instructions      You have a viral illness which will improve on its own with rest, fluids, and medications to help with your symptoms.  Tylenol  and/or ibuprofen , guaifenesin  (plain mucinex ), and saline nasal sprays may help relieve symptoms.   Two teaspoons of honey in 1 cup of warm water every 4-6 hours may help with throat pains.  Salt water gargles, 1/2-1 teaspoon of salt dissolved in 1 cup of warm water, gargle and spit out every 4-6 hours  may help with throat pains.  Humidifier in room at nighttime may help soothe cough (clean well daily).   Take Promethazine  DM cough medication to help with your cough at nighttime so that you are able to sleep. Do not drive, drink alcohol, or go to work while taking this medication since it can make you sleepy. Only take this at nighttime.   For chest pain, shortness of breath, inability to keep food or fluids down without vomiting,  fever that does not respond to tylenol  or motrin , or any other severe symptoms, please go to the ER for further evaluation. Return to urgent care as needed, otherwise follow-up with PCP.       ED Prescriptions     Medication Sig Dispense Auth. Provider   promethazine -dextromethorphan (PROMETHAZINE -DM) 6.25-15 MG/5ML syrup Take 5 mLs by mouth at bedtime as needed for cough. 118 mL Rudene Poulsen C, FNP      PDMP not reviewed this encounter.    [1]  Social History Tobacco Use   Smoking status: Former    Current packs/day: 0.00    Average packs/day: 0.3 packs/day for 1 year (0.3 ttl pk-yrs)    Types: Cigarettes    Start date: 06/11/1980    Quit date: 06/11/1981    Years since quitting: 43.0   Smokeless tobacco: Never  Vaping Use   Vaping status: Never Used  Substance Use Topics   Alcohol use: Not Currently    Comment: rare   Drug use: No     Lennice Jon BROCKS, FNP 06/24/24 0912  "

## 2024-06-24 NOTE — Discharge Instructions (Addendum)
 You have a viral illness which will improve on its own with rest, fluids, and medications to help with your symptoms.  Tylenol  and/or ibuprofen , guaifenesin  (plain mucinex ), and saline nasal sprays may help relieve symptoms.   Two teaspoons of honey in 1 cup of warm water every 4-6 hours may help with throat pains.  Salt water gargles, 1/2-1 teaspoon of salt dissolved in 1 cup of warm water, gargle and spit out every 4-6 hours may help with throat pains.  Humidifier in room at nighttime may help soothe cough (clean well daily).   Take Promethazine  DM cough medication to help with your cough at nighttime so that you are able to sleep. Do not drive, drink alcohol, or go to work while taking this medication since it can make you sleepy. Only take this at nighttime.   For chest pain, shortness of breath, inability to keep food or fluids down without vomiting, fever that does not respond to tylenol  or motrin , or any other severe symptoms, please go to the ER for further evaluation. Return to urgent care as needed, otherwise follow-up with PCP.

## 2024-12-23 ENCOUNTER — Encounter: Payer: Self-pay | Admitting: Medical
